# Patient Record
Sex: Female | Born: 1935
Health system: Southern US, Community
[De-identification: ages and names within clinical notes are randomized; demographics above are authoritative.]

## PROBLEM LIST (undated history)

## (undated) DIAGNOSIS — F32A Depression, unspecified: Secondary | ICD-10-CM

## (undated) DIAGNOSIS — I4892 Unspecified atrial flutter: Secondary | ICD-10-CM

## (undated) DIAGNOSIS — E079 Disorder of thyroid, unspecified: Secondary | ICD-10-CM

---

## 2001-11-15 ENCOUNTER — Other Ambulatory Visit: Admission: RE | Admit: 2001-11-15 | Discharge: 2001-11-15 | Payer: Self-pay | Admitting: Obstetrics and Gynecology

## 2002-12-26 ENCOUNTER — Encounter: Payer: Self-pay | Admitting: Family Medicine

## 2002-12-26 ENCOUNTER — Encounter: Admission: RE | Admit: 2002-12-26 | Discharge: 2002-12-26 | Payer: Self-pay | Admitting: Family Medicine

## 2004-07-01 ENCOUNTER — Ambulatory Visit (HOSPITAL_COMMUNITY): Admission: RE | Admit: 2004-07-01 | Discharge: 2004-07-01 | Payer: Self-pay | Admitting: Gastroenterology

## 2004-11-07 ENCOUNTER — Encounter: Admission: RE | Admit: 2004-11-07 | Discharge: 2004-11-07 | Payer: Self-pay | Admitting: Family Medicine

## 2004-12-24 ENCOUNTER — Encounter: Admission: RE | Admit: 2004-12-24 | Discharge: 2004-12-24 | Payer: Self-pay | Admitting: Family Medicine

## 2016-02-05 DIAGNOSIS — Z79899 Other long term (current) drug therapy: Secondary | ICD-10-CM | POA: Diagnosis not present

## 2016-02-05 DIAGNOSIS — F4320 Adjustment disorder, unspecified: Secondary | ICD-10-CM | POA: Diagnosis not present

## 2016-02-18 DIAGNOSIS — L570 Actinic keratosis: Secondary | ICD-10-CM | POA: Diagnosis not present

## 2016-02-18 DIAGNOSIS — L920 Granuloma annulare: Secondary | ICD-10-CM | POA: Diagnosis not present

## 2016-03-12 DIAGNOSIS — H524 Presbyopia: Secondary | ICD-10-CM | POA: Diagnosis not present

## 2016-03-17 DIAGNOSIS — L570 Actinic keratosis: Secondary | ICD-10-CM | POA: Diagnosis not present

## 2016-08-24 DIAGNOSIS — Z79899 Other long term (current) drug therapy: Secondary | ICD-10-CM | POA: Diagnosis not present

## 2016-08-24 DIAGNOSIS — M810 Age-related osteoporosis without current pathological fracture: Secondary | ICD-10-CM | POA: Diagnosis not present

## 2016-08-24 DIAGNOSIS — M150 Primary generalized (osteo)arthritis: Secondary | ICD-10-CM | POA: Diagnosis not present

## 2016-08-24 DIAGNOSIS — K589 Irritable bowel syndrome without diarrhea: Secondary | ICD-10-CM | POA: Diagnosis not present

## 2016-08-24 DIAGNOSIS — R636 Underweight: Secondary | ICD-10-CM | POA: Diagnosis not present

## 2016-08-24 DIAGNOSIS — F322 Major depressive disorder, single episode, severe without psychotic features: Secondary | ICD-10-CM | POA: Diagnosis not present

## 2016-08-24 DIAGNOSIS — F4320 Adjustment disorder, unspecified: Secondary | ICD-10-CM | POA: Diagnosis not present

## 2016-08-24 DIAGNOSIS — L858 Other specified epidermal thickening: Secondary | ICD-10-CM | POA: Diagnosis not present

## 2016-08-24 DIAGNOSIS — Z23 Encounter for immunization: Secondary | ICD-10-CM | POA: Diagnosis not present

## 2016-08-24 DIAGNOSIS — Z0000 Encounter for general adult medical examination without abnormal findings: Secondary | ICD-10-CM | POA: Diagnosis not present

## 2016-09-09 DIAGNOSIS — M810 Age-related osteoporosis without current pathological fracture: Secondary | ICD-10-CM | POA: Diagnosis not present

## 2016-09-15 DIAGNOSIS — C44719 Basal cell carcinoma of skin of left lower limb, including hip: Secondary | ICD-10-CM | POA: Diagnosis not present

## 2016-09-15 DIAGNOSIS — C44629 Squamous cell carcinoma of skin of left upper limb, including shoulder: Secondary | ICD-10-CM | POA: Diagnosis not present

## 2016-11-23 DIAGNOSIS — Z6822 Body mass index (BMI) 22.0-22.9, adult: Secondary | ICD-10-CM | POA: Diagnosis not present

## 2016-11-23 DIAGNOSIS — R636 Underweight: Secondary | ICD-10-CM | POA: Diagnosis not present

## 2016-11-23 DIAGNOSIS — F322 Major depressive disorder, single episode, severe without psychotic features: Secondary | ICD-10-CM | POA: Diagnosis not present

## 2016-11-26 DIAGNOSIS — C44719 Basal cell carcinoma of skin of left lower limb, including hip: Secondary | ICD-10-CM | POA: Diagnosis not present

## 2016-12-17 DIAGNOSIS — J069 Acute upper respiratory infection, unspecified: Secondary | ICD-10-CM | POA: Diagnosis not present

## 2017-01-18 DIAGNOSIS — H6123 Impacted cerumen, bilateral: Secondary | ICD-10-CM | POA: Diagnosis not present

## 2017-01-18 DIAGNOSIS — H6523 Chronic serous otitis media, bilateral: Secondary | ICD-10-CM | POA: Diagnosis not present

## 2017-02-17 DIAGNOSIS — L0390 Cellulitis, unspecified: Secondary | ICD-10-CM | POA: Diagnosis not present

## 2017-02-17 DIAGNOSIS — T798XXA Other early complications of trauma, initial encounter: Secondary | ICD-10-CM | POA: Diagnosis not present

## 2017-02-17 DIAGNOSIS — H906 Mixed conductive and sensorineural hearing loss, bilateral: Secondary | ICD-10-CM | POA: Diagnosis not present

## 2017-03-15 DIAGNOSIS — H524 Presbyopia: Secondary | ICD-10-CM | POA: Diagnosis not present

## 2017-04-12 DIAGNOSIS — L570 Actinic keratosis: Secondary | ICD-10-CM | POA: Diagnosis not present

## 2017-04-12 DIAGNOSIS — C44719 Basal cell carcinoma of skin of left lower limb, including hip: Secondary | ICD-10-CM | POA: Diagnosis not present

## 2017-06-29 DIAGNOSIS — C44719 Basal cell carcinoma of skin of left lower limb, including hip: Secondary | ICD-10-CM | POA: Diagnosis not present

## 2017-06-29 DIAGNOSIS — L570 Actinic keratosis: Secondary | ICD-10-CM | POA: Diagnosis not present

## 2017-08-31 DIAGNOSIS — M150 Primary generalized (osteo)arthritis: Secondary | ICD-10-CM | POA: Diagnosis not present

## 2017-08-31 DIAGNOSIS — M810 Age-related osteoporosis without current pathological fracture: Secondary | ICD-10-CM | POA: Diagnosis not present

## 2017-08-31 DIAGNOSIS — Z0000 Encounter for general adult medical examination without abnormal findings: Secondary | ICD-10-CM | POA: Diagnosis not present

## 2017-08-31 DIAGNOSIS — K589 Irritable bowel syndrome without diarrhea: Secondary | ICD-10-CM | POA: Diagnosis not present

## 2017-10-06 DIAGNOSIS — L570 Actinic keratosis: Secondary | ICD-10-CM | POA: Diagnosis not present

## 2017-10-06 DIAGNOSIS — Z85828 Personal history of other malignant neoplasm of skin: Secondary | ICD-10-CM | POA: Diagnosis not present

## 2017-10-06 DIAGNOSIS — L905 Scar conditions and fibrosis of skin: Secondary | ICD-10-CM | POA: Diagnosis not present

## 2018-01-04 DIAGNOSIS — L905 Scar conditions and fibrosis of skin: Secondary | ICD-10-CM | POA: Diagnosis not present

## 2018-01-04 DIAGNOSIS — L570 Actinic keratosis: Secondary | ICD-10-CM | POA: Diagnosis not present

## 2018-01-04 DIAGNOSIS — Z85828 Personal history of other malignant neoplasm of skin: Secondary | ICD-10-CM | POA: Diagnosis not present

## 2018-04-05 DIAGNOSIS — Z85828 Personal history of other malignant neoplasm of skin: Secondary | ICD-10-CM | POA: Diagnosis not present

## 2018-04-05 DIAGNOSIS — L905 Scar conditions and fibrosis of skin: Secondary | ICD-10-CM | POA: Diagnosis not present

## 2018-04-27 DIAGNOSIS — H5213 Myopia, bilateral: Secondary | ICD-10-CM | POA: Diagnosis not present

## 2018-04-27 DIAGNOSIS — H524 Presbyopia: Secondary | ICD-10-CM | POA: Diagnosis not present

## 2018-04-27 DIAGNOSIS — H52203 Unspecified astigmatism, bilateral: Secondary | ICD-10-CM | POA: Diagnosis not present

## 2018-04-27 DIAGNOSIS — Z961 Presence of intraocular lens: Secondary | ICD-10-CM | POA: Diagnosis not present

## 2018-09-09 DIAGNOSIS — Z01419 Encounter for gynecological examination (general) (routine) without abnormal findings: Secondary | ICD-10-CM | POA: Diagnosis not present

## 2018-09-09 DIAGNOSIS — N9089 Other specified noninflammatory disorders of vulva and perineum: Secondary | ICD-10-CM | POA: Diagnosis not present

## 2018-09-14 DIAGNOSIS — F322 Major depressive disorder, single episode, severe without psychotic features: Secondary | ICD-10-CM | POA: Diagnosis not present

## 2018-09-14 DIAGNOSIS — Z0000 Encounter for general adult medical examination without abnormal findings: Secondary | ICD-10-CM | POA: Diagnosis not present

## 2018-09-14 DIAGNOSIS — M810 Age-related osteoporosis without current pathological fracture: Secondary | ICD-10-CM | POA: Diagnosis not present

## 2018-09-14 DIAGNOSIS — K589 Irritable bowel syndrome without diarrhea: Secondary | ICD-10-CM | POA: Diagnosis not present

## 2018-09-23 DIAGNOSIS — Z8262 Family history of osteoporosis: Secondary | ICD-10-CM | POA: Diagnosis not present

## 2018-09-23 DIAGNOSIS — Z803 Family history of malignant neoplasm of breast: Secondary | ICD-10-CM | POA: Diagnosis not present

## 2018-09-23 DIAGNOSIS — Z1231 Encounter for screening mammogram for malignant neoplasm of breast: Secondary | ICD-10-CM | POA: Diagnosis not present

## 2018-09-23 DIAGNOSIS — M810 Age-related osteoporosis without current pathological fracture: Secondary | ICD-10-CM | POA: Diagnosis not present

## 2018-09-23 DIAGNOSIS — Z780 Asymptomatic menopausal state: Secondary | ICD-10-CM | POA: Diagnosis not present

## 2018-10-04 DIAGNOSIS — C44719 Basal cell carcinoma of skin of left lower limb, including hip: Secondary | ICD-10-CM | POA: Diagnosis not present

## 2018-10-04 DIAGNOSIS — Z85828 Personal history of other malignant neoplasm of skin: Secondary | ICD-10-CM | POA: Diagnosis not present

## 2018-10-04 DIAGNOSIS — L570 Actinic keratosis: Secondary | ICD-10-CM | POA: Diagnosis not present

## 2019-01-10 DIAGNOSIS — C44719 Basal cell carcinoma of skin of left lower limb, including hip: Secondary | ICD-10-CM | POA: Diagnosis not present

## 2019-05-02 DIAGNOSIS — H524 Presbyopia: Secondary | ICD-10-CM | POA: Diagnosis not present

## 2019-05-02 DIAGNOSIS — H5213 Myopia, bilateral: Secondary | ICD-10-CM | POA: Diagnosis not present

## 2019-05-02 DIAGNOSIS — H52203 Unspecified astigmatism, bilateral: Secondary | ICD-10-CM | POA: Diagnosis not present

## 2019-05-02 DIAGNOSIS — Z961 Presence of intraocular lens: Secondary | ICD-10-CM | POA: Diagnosis not present

## 2019-05-10 DIAGNOSIS — Z0120 Encounter for dental examination and cleaning without abnormal findings: Secondary | ICD-10-CM | POA: Diagnosis not present

## 2019-05-16 DIAGNOSIS — Z85828 Personal history of other malignant neoplasm of skin: Secondary | ICD-10-CM | POA: Diagnosis not present

## 2019-05-16 DIAGNOSIS — L920 Granuloma annulare: Secondary | ICD-10-CM | POA: Diagnosis not present

## 2019-05-16 DIAGNOSIS — D485 Neoplasm of uncertain behavior of skin: Secondary | ICD-10-CM | POA: Diagnosis not present

## 2019-07-19 DIAGNOSIS — R51 Headache: Secondary | ICD-10-CM | POA: Diagnosis not present

## 2019-07-19 DIAGNOSIS — R5383 Other fatigue: Secondary | ICD-10-CM | POA: Diagnosis not present

## 2019-07-19 DIAGNOSIS — Z7189 Other specified counseling: Secondary | ICD-10-CM | POA: Diagnosis not present

## 2019-08-05 DIAGNOSIS — Z23 Encounter for immunization: Secondary | ICD-10-CM | POA: Diagnosis not present

## 2019-10-03 DIAGNOSIS — Z79899 Other long term (current) drug therapy: Secondary | ICD-10-CM | POA: Diagnosis not present

## 2019-10-04 DIAGNOSIS — F322 Major depressive disorder, single episode, severe without psychotic features: Secondary | ICD-10-CM | POA: Diagnosis not present

## 2019-10-04 DIAGNOSIS — M810 Age-related osteoporosis without current pathological fracture: Secondary | ICD-10-CM | POA: Diagnosis not present

## 2019-10-04 DIAGNOSIS — Z0000 Encounter for general adult medical examination without abnormal findings: Secondary | ICD-10-CM | POA: Diagnosis not present

## 2019-10-04 DIAGNOSIS — K589 Irritable bowel syndrome without diarrhea: Secondary | ICD-10-CM | POA: Diagnosis not present

## 2019-11-13 DIAGNOSIS — L920 Granuloma annulare: Secondary | ICD-10-CM | POA: Diagnosis not present

## 2019-11-13 DIAGNOSIS — L570 Actinic keratosis: Secondary | ICD-10-CM | POA: Diagnosis not present

## 2019-11-13 DIAGNOSIS — Z85828 Personal history of other malignant neoplasm of skin: Secondary | ICD-10-CM | POA: Diagnosis not present

## 2019-11-13 DIAGNOSIS — L905 Scar conditions and fibrosis of skin: Secondary | ICD-10-CM | POA: Diagnosis not present

## 2019-11-14 DIAGNOSIS — Z0120 Encounter for dental examination and cleaning without abnormal findings: Secondary | ICD-10-CM | POA: Diagnosis not present

## 2020-05-07 DIAGNOSIS — H52203 Unspecified astigmatism, bilateral: Secondary | ICD-10-CM | POA: Diagnosis not present

## 2020-05-07 DIAGNOSIS — H524 Presbyopia: Secondary | ICD-10-CM | POA: Diagnosis not present

## 2020-05-07 DIAGNOSIS — Z961 Presence of intraocular lens: Secondary | ICD-10-CM | POA: Diagnosis not present

## 2020-05-08 DIAGNOSIS — F322 Major depressive disorder, single episode, severe without psychotic features: Secondary | ICD-10-CM | POA: Diagnosis not present

## 2020-05-08 DIAGNOSIS — L723 Sebaceous cyst: Secondary | ICD-10-CM | POA: Diagnosis not present

## 2020-05-08 DIAGNOSIS — R636 Underweight: Secondary | ICD-10-CM | POA: Diagnosis not present

## 2020-07-16 DIAGNOSIS — Z0120 Encounter for dental examination and cleaning without abnormal findings: Secondary | ICD-10-CM | POA: Diagnosis not present

## 2020-08-26 DIAGNOSIS — Z803 Family history of malignant neoplasm of breast: Secondary | ICD-10-CM | POA: Diagnosis not present

## 2020-08-26 DIAGNOSIS — Z1231 Encounter for screening mammogram for malignant neoplasm of breast: Secondary | ICD-10-CM | POA: Diagnosis not present

## 2020-09-11 DIAGNOSIS — R2689 Other abnormalities of gait and mobility: Secondary | ICD-10-CM | POA: Diagnosis not present

## 2020-09-11 DIAGNOSIS — Z23 Encounter for immunization: Secondary | ICD-10-CM | POA: Diagnosis not present

## 2020-09-17 DIAGNOSIS — M1990 Unspecified osteoarthritis, unspecified site: Secondary | ICD-10-CM | POA: Diagnosis not present

## 2020-09-17 DIAGNOSIS — F339 Major depressive disorder, recurrent, unspecified: Secondary | ICD-10-CM | POA: Diagnosis not present

## 2020-09-17 DIAGNOSIS — H9193 Unspecified hearing loss, bilateral: Secondary | ICD-10-CM | POA: Diagnosis not present

## 2020-09-17 DIAGNOSIS — Z9181 History of falling: Secondary | ICD-10-CM | POA: Diagnosis not present

## 2020-09-17 DIAGNOSIS — N393 Stress incontinence (female) (male): Secondary | ICD-10-CM | POA: Diagnosis not present

## 2020-09-17 DIAGNOSIS — R270 Ataxia, unspecified: Secondary | ICD-10-CM | POA: Diagnosis not present

## 2020-09-17 DIAGNOSIS — M810 Age-related osteoporosis without current pathological fracture: Secondary | ICD-10-CM | POA: Diagnosis not present

## 2020-09-17 DIAGNOSIS — K589 Irritable bowel syndrome without diarrhea: Secondary | ICD-10-CM | POA: Diagnosis not present

## 2020-09-23 DIAGNOSIS — C44719 Basal cell carcinoma of skin of left lower limb, including hip: Secondary | ICD-10-CM | POA: Diagnosis not present

## 2020-10-17 DIAGNOSIS — R270 Ataxia, unspecified: Secondary | ICD-10-CM | POA: Diagnosis not present

## 2020-10-17 DIAGNOSIS — K589 Irritable bowel syndrome without diarrhea: Secondary | ICD-10-CM | POA: Diagnosis not present

## 2020-10-17 DIAGNOSIS — M1990 Unspecified osteoarthritis, unspecified site: Secondary | ICD-10-CM | POA: Diagnosis not present

## 2020-10-17 DIAGNOSIS — F339 Major depressive disorder, recurrent, unspecified: Secondary | ICD-10-CM | POA: Diagnosis not present

## 2020-10-17 DIAGNOSIS — Z9181 History of falling: Secondary | ICD-10-CM | POA: Diagnosis not present

## 2020-10-17 DIAGNOSIS — M810 Age-related osteoporosis without current pathological fracture: Secondary | ICD-10-CM | POA: Diagnosis not present

## 2020-10-17 DIAGNOSIS — N393 Stress incontinence (female) (male): Secondary | ICD-10-CM | POA: Diagnosis not present

## 2020-10-17 DIAGNOSIS — H9193 Unspecified hearing loss, bilateral: Secondary | ICD-10-CM | POA: Diagnosis not present

## 2020-11-06 DIAGNOSIS — Z0000 Encounter for general adult medical examination without abnormal findings: Secondary | ICD-10-CM | POA: Diagnosis not present

## 2020-11-06 DIAGNOSIS — F322 Major depressive disorder, single episode, severe without psychotic features: Secondary | ICD-10-CM | POA: Diagnosis not present

## 2020-11-06 DIAGNOSIS — R5383 Other fatigue: Secondary | ICD-10-CM | POA: Diagnosis not present

## 2020-11-06 DIAGNOSIS — M150 Primary generalized (osteo)arthritis: Secondary | ICD-10-CM | POA: Diagnosis not present

## 2020-11-06 DIAGNOSIS — M810 Age-related osteoporosis without current pathological fracture: Secondary | ICD-10-CM | POA: Diagnosis not present

## 2020-11-16 DIAGNOSIS — H9193 Unspecified hearing loss, bilateral: Secondary | ICD-10-CM | POA: Diagnosis not present

## 2020-11-16 DIAGNOSIS — R270 Ataxia, unspecified: Secondary | ICD-10-CM | POA: Diagnosis not present

## 2020-11-16 DIAGNOSIS — K589 Irritable bowel syndrome without diarrhea: Secondary | ICD-10-CM | POA: Diagnosis not present

## 2020-11-16 DIAGNOSIS — N393 Stress incontinence (female) (male): Secondary | ICD-10-CM | POA: Diagnosis not present

## 2020-11-16 DIAGNOSIS — F339 Major depressive disorder, recurrent, unspecified: Secondary | ICD-10-CM | POA: Diagnosis not present

## 2020-11-16 DIAGNOSIS — M810 Age-related osteoporosis without current pathological fracture: Secondary | ICD-10-CM | POA: Diagnosis not present

## 2020-11-16 DIAGNOSIS — Z9181 History of falling: Secondary | ICD-10-CM | POA: Diagnosis not present

## 2020-11-16 DIAGNOSIS — M1990 Unspecified osteoarthritis, unspecified site: Secondary | ICD-10-CM | POA: Diagnosis not present

## 2020-12-20 ENCOUNTER — Emergency Department (HOSPITAL_COMMUNITY): Payer: Medicare Other

## 2020-12-20 ENCOUNTER — Encounter (HOSPITAL_COMMUNITY): Payer: Self-pay | Admitting: *Deleted

## 2020-12-20 ENCOUNTER — Other Ambulatory Visit: Payer: Self-pay

## 2020-12-20 ENCOUNTER — Inpatient Hospital Stay (HOSPITAL_COMMUNITY)
Admission: EM | Admit: 2020-12-20 | Discharge: 2020-12-24 | DRG: 310 | Disposition: A | Discharge status: Home / Self Care | Payer: Medicare Other | Attending: Internal Medicine | Admitting: Internal Medicine

## 2020-12-20 DIAGNOSIS — Z79890 Hormone replacement therapy (postmenopausal): Secondary | ICD-10-CM

## 2020-12-20 DIAGNOSIS — I9589 Other hypotension: Secondary | ICD-10-CM | POA: Diagnosis not present

## 2020-12-20 DIAGNOSIS — I483 Typical atrial flutter: Secondary | ICD-10-CM | POA: Diagnosis not present

## 2020-12-20 DIAGNOSIS — I4892 Unspecified atrial flutter: Secondary | ICD-10-CM | POA: Diagnosis not present

## 2020-12-20 DIAGNOSIS — I4891 Unspecified atrial fibrillation: Secondary | ICD-10-CM | POA: Diagnosis present

## 2020-12-20 DIAGNOSIS — Z20822 Contact with and (suspected) exposure to covid-19: Secondary | ICD-10-CM | POA: Diagnosis present

## 2020-12-20 DIAGNOSIS — R002 Palpitations: Secondary | ICD-10-CM | POA: Diagnosis not present

## 2020-12-20 DIAGNOSIS — R001 Bradycardia, unspecified: Secondary | ICD-10-CM | POA: Diagnosis present

## 2020-12-20 DIAGNOSIS — R079 Chest pain, unspecified: Secondary | ICD-10-CM | POA: Diagnosis not present

## 2020-12-20 DIAGNOSIS — E039 Hypothyroidism, unspecified: Secondary | ICD-10-CM | POA: Diagnosis present

## 2020-12-20 DIAGNOSIS — R55 Syncope and collapse: Secondary | ICD-10-CM

## 2020-12-20 DIAGNOSIS — T461X5A Adverse effect of calcium-channel blockers, initial encounter: Secondary | ICD-10-CM | POA: Diagnosis not present

## 2020-12-20 DIAGNOSIS — Z79899 Other long term (current) drug therapy: Secondary | ICD-10-CM

## 2020-12-20 DIAGNOSIS — R0789 Other chest pain: Secondary | ICD-10-CM | POA: Diagnosis not present

## 2020-12-20 DIAGNOSIS — I499 Cardiac arrhythmia, unspecified: Secondary | ICD-10-CM | POA: Diagnosis not present

## 2020-12-20 DIAGNOSIS — I471 Supraventricular tachycardia: Secondary | ICD-10-CM | POA: Diagnosis not present

## 2020-12-20 DIAGNOSIS — F32A Depression, unspecified: Secondary | ICD-10-CM | POA: Diagnosis present

## 2020-12-20 HISTORY — DX: Disorder of thyroid, unspecified: E07.9

## 2020-12-20 HISTORY — DX: Depression, unspecified: F32.A

## 2020-12-20 LAB — TSH: TSH: 10.459 u[IU]/mL — ABNORMAL HIGH (ref 0.350–4.500)

## 2020-12-20 LAB — COMPREHENSIVE METABOLIC PANEL
ALT: 25 U/L (ref 0–44)
AST: 39 U/L (ref 15–41)
Albumin: 4 g/dL (ref 3.5–5.0)
Alkaline Phosphatase: 63 U/L (ref 38–126)
Anion gap: 12 (ref 5–15)
BUN: 20 mg/dL (ref 8–23)
CO2: 24 mmol/L (ref 22–32)
Calcium: 10 mg/dL (ref 8.9–10.3)
Chloride: 103 mmol/L (ref 98–111)
Creatinine, Ser: 0.92 mg/dL (ref 0.44–1.00)
GFR, Estimated: 60 mL/min — ABNORMAL LOW (ref >60)
Glucose, Bld: 105 mg/dL — ABNORMAL HIGH (ref 70–99)
Potassium: 4.3 mmol/L (ref 3.5–5.1)
Sodium: 139 mmol/L (ref 135–145)
Total Bilirubin: 0.8 mg/dL (ref 0.3–1.2)
Total Protein: 6.9 g/dL (ref 6.5–8.1)

## 2020-12-20 LAB — CBC WITH DIFFERENTIAL/PLATELET
Abs Immature Granulocytes: 0.03 10*3/uL (ref 0.00–0.07)
Basophils Absolute: 0 10*3/uL (ref 0.0–0.1)
Basophils Relative: 0 %
Eosinophils Absolute: 0 10*3/uL (ref 0.0–0.5)
Eosinophils Relative: 0 %
HCT: 45.2 % (ref 36.0–46.0)
Hemoglobin: 14.2 g/dL (ref 12.0–15.0)
Immature Granulocytes: 0 %
Lymphocytes Relative: 14 %
Lymphs Abs: 1.3 10*3/uL (ref 0.7–4.0)
MCH: 32.5 pg (ref 26.0–34.0)
MCHC: 31.4 g/dL (ref 30.0–36.0)
MCV: 103.4 fL — ABNORMAL HIGH (ref 80.0–100.0)
Monocytes Absolute: 0.6 10*3/uL (ref 0.1–1.0)
Monocytes Relative: 6 %
Neutro Abs: 7.4 10*3/uL (ref 1.7–7.7)
Neutrophils Relative %: 80 %
Platelets: 199 10*3/uL (ref 150–400)
RBC: 4.37 MIL/uL (ref 3.87–5.11)
RDW: 12.2 % (ref 11.5–15.5)
WBC: 9.4 10*3/uL (ref 4.0–10.5)
nRBC: 0 % (ref 0.0–0.2)

## 2020-12-20 LAB — PROTIME-INR
INR: 1 (ref 0.8–1.2)
Prothrombin Time: 12.7 seconds (ref 11.4–15.2)

## 2020-12-20 LAB — RESP PANEL BY RT-PCR (FLU A&B, COVID) ARPGX2
Influenza A by PCR: NEGATIVE
Influenza B by PCR: NEGATIVE
SARS Coronavirus 2 by RT PCR: NEGATIVE

## 2020-12-20 LAB — BRAIN NATRIURETIC PEPTIDE: B Natriuretic Peptide: 238.1 pg/mL — ABNORMAL HIGH (ref 0.0–100.0)

## 2020-12-20 LAB — TROPONIN I (HIGH SENSITIVITY)
Troponin I (High Sensitivity): 13 ng/L (ref <18)
Troponin I (High Sensitivity): 16 ng/L (ref <18)

## 2020-12-20 LAB — HEPARIN LEVEL (UNFRACTIONATED): Heparin Unfractionated: 0.89 IU/mL — ABNORMAL HIGH (ref 0.30–0.70)

## 2020-12-20 LAB — MAGNESIUM: Magnesium: 2.2 mg/dL (ref 1.7–2.4)

## 2020-12-20 MED ORDER — ACETAMINOPHEN 325 MG PO TABS: 650.0000 mg | ORAL_TABLET | ORAL | Status: DC | PRN

## 2020-12-20 MED ORDER — METOPROLOL TARTRATE 12.5 MG HALF TABLET
12.5000 mg | ORAL_TABLET | Freq: Two times a day (BID) | ORAL | Status: DC
  Administered 2020-12-20 – 2020-12-21 (×3): 12.5 mg via ORAL
  Filled 2020-12-20 (×3): qty 1

## 2020-12-20 MED ORDER — ONDANSETRON HCL 4 MG/2ML IJ SOLN: 4.0000 mg | Freq: Four times a day (QID) | INTRAMUSCULAR | Status: DC | PRN

## 2020-12-20 MED ORDER — HEPARIN (PORCINE) 25000 UT/250ML-% IV SOLN
700.0000 [IU]/h | INTRAVENOUS | Status: DC
  Administered 2020-12-20: 900 [IU]/h via INTRAVENOUS
  Filled 2020-12-20 (×2): qty 250

## 2020-12-20 MED ORDER — METOPROLOL TARTRATE 5 MG/5ML IV SOLN
5.0000 mg | Freq: Once | INTRAVENOUS | Status: AC
  Administered 2020-12-20: 5 mg via INTRAVENOUS
  Filled 2020-12-20: qty 5

## 2020-12-20 MED ORDER — HEPARIN BOLUS VIA INFUSION
3500.0000 [IU] | Freq: Once | INTRAVENOUS | Status: AC
  Administered 2020-12-20: 3500 [IU] via INTRAVENOUS
  Filled 2020-12-20: qty 3500

## 2020-12-20 NOTE — ED Triage Notes (Addendum)
Pt here via [facility] for 2 "spells" the last few days where her heart starts racing and coming out of her chest and she feels "really woozy like".  No falls or loc.  EMS stated initial HR was afib that changed to a flutter after 2 minutes.  Initial O2 as 88% and increased to 100% on 3L.  Sats presently 100% on RA. Pt states she was supposed to go the the Dr's office today to have her thyroid checked.  States she has been on thyroid meds x 1 month.

## 2020-12-20 NOTE — Consult Note (Signed)
Cardiology Consultation:   Patient ID: Diana Lee MRN: [id number]; DOB: 31-May-1929  Admit date: 12/20/2020 Date of Consult: 12/20/2020  PCP:  [name], MD   [facility]  Cardiologist:  No primary care provider on file. - New Dr. Jacques Navy Advanced Practice Provider:  No care team member to display Electrophysiologist:  None       Patient Profile:   Diana Lee is a 87 y.o. female with a hx of hypothyroidism and depression who is being seen today for the evaluation of atrial flutter at the request of Dr. Katrinka Blazing.  History of Present Illness:   Diana Lee is a remarkably healthy 87 year old female with a past medical history only significant for hypothyroidism and depression.  She has no past cardiovascular history.  She has tried to live a healthy life with activity and good diet.  She has recently been working with physical therapy to build her strength and felt strong enough on Wednesday evening to go outside and pick up sticks.  The weather was good and she thought this would be good for her.  As she was working she began to feel unwell and recalls speaking to her son who is present in the room today for a while while she took some rest, but shortly afterward felt unwell and almost as if she were crawling back into the house.  She notes a sense of racing heart and as if her heart was going to beat out of her chest.  Denies chest pain or associated shortness of breath denies significant lightheadedness and had no syncope.  She had a good day yesterday and does not recall palpitations, chest pain, or shortness of breath.  Today she had a recurrence of rapid heartbeats and palpitations and presented to her doctor for thyroid laboratory studies.  TSH was found to be around 10, and due to palpitations she presented to the ER.  EKG: Atrial flutter with 2-1 AV block and a heart rate of around 129 bpm.  Poor R wave progression with anterior infarct pattern.  Patient was  given 5 mg of IV metoprolol in the ED which has brought her heart rate down into the 90s to 110 bpm range on telemetry now.  She is comfortably lying in bed without active palpitations.  She has been started on metoprolol tartrate 12.5 mg twice daily.  The patient denies chest pain, chest pressure, dyspnea at rest or with exertion, PND, orthopnea, or leg swelling. Denies cough, fever, chills. Denies nausea, vomiting. Denies syncope or presyncope. Denies dizziness or lightheadedness.  Record suggests she may have had a fever, but I have confirmed with the patient that she has had no urinary symptoms, no fever and no symptoms of pneumonia.    Past Medical History:  Diagnosis Date  . Depression   . Thyroid disease     History reviewed. No pertinent surgical history.   Home Medications:  Prior to Admission medications   Medication Sig Start Date End Date Taking? Authorizing Provider  acetaminophen (TYLENOL) 500 MG tablet Take 500 mg by mouth every 6 (six) hours as needed for mild pain.   Yes [provider]  Calcium Carbonate (CALTRATE 600 PO) Take 1 tablet by mouth daily.   Yes [provider]  cholecalciferol (VITAMIN D3) 25 MCG (1000 UNIT) tablet Take 1,000 Units by mouth daily.   Yes [provider]  levothyroxine (SYNTHROID) 25 MCG tablet Take 25 mcg by mouth daily. 11/11/20  Yes [provider]  sertraline (  ZOLOFT) 50 MG tablet Take 50 mg by mouth daily. 12/13/20  Yes [provider]    Inpatient Medications: Scheduled Meds: . metoprolol tartrate  12.5 mg Oral BID   Continuous Infusions: . heparin 900 Units/hr (12/20/20 1622)   PRN Meds: acetaminophen, ondansetron (ZOFRAN) IV  Allergies:   No Known Allergies  Social History:   Social History   Socioeconomic History  . Marital status: Married    Spouse name: Not on file  . Number of children: Not on file  . Years of education: Not on file  . Highest education level: Not on file   Occupational History  . Not on file  Tobacco Use  . Smoking status: Never Smoker  . Smokeless tobacco: Not on file  Vaping Use  . Vaping Use: Never used  Substance and Sexual Activity  . Alcohol use: Never  . Drug use: Never  . Sexual activity: Not Currently  Other Topics Concern  . Not on file  Social History Narrative  . Not on file   Social Determinants of Health   Financial Resource Strain: Not on file  Food Insecurity: Not on file  Transportation Needs: Not on file  Physical Activity: Not on file  Stress: Not on file  Social Connections: Not on file  Intimate Partner Violence: Not on file    Family History:   History reviewed. No pertinent family history.   ROS:  Please see the history of present illness.   All other ROS reviewed and negative.     Physical Exam/Data:   Vitals:   12/20/20 1718 12/20/20 1720 12/20/20 1914 12/20/20 2100  BP: (!) 147/100 (!) 143/106 96/62 138/67  Pulse: (!) 109  92 80  Resp:   16 18  Temp: 98.9 F (37.2 C)  98.2 F (36.8 C) 98.2 F (36.8 C)  TempSrc: Oral  Oral Oral  SpO2: 97% 98% 96% 96%  Weight:  60.9 kg    Height:  5\' 4"  (1.626 m)     No intake or output data in the 24 hours ending 12/20/20 2242 Last 3 Weights 12/20/2020 12/20/2020  Weight (lbs) 134 lb 4.8 oz 133 lb  Weight (kg) 60.918 kg 60.328 kg     Body mass index is 23.05 kg/m.  Constitutional: No acute distress Eyes: sclera non-icteric, normal conjunctiva and lids ENMT: normal dentition, moist mucous membranes Cardiovascular: regular rhythm, normal rate, no murmurs. S1 and S2 normal. Radial pulses normal bilaterally. No jugular venous distention.  Respiratory: clear to auscultation bilaterally GI : normal bowel sounds, soft and nontender. No distention.   MSK: extremities warm, well perfused. No edema.  NEURO: grossly nonfocal exam, moves all extremities. PSYCH: alert and oriented x 3, normal mood and affect.   EKG:  The EKG was personally reviewed and  demonstrates: Atrial flutter with 2-1 AV conduction with a heart rate of 130 bpm Telemetry:  Telemetry was personally reviewed and demonstrates: Atrial flutter variable conduction rate around 100  Relevant CV Studies: Echocardiogram ordered and pending  Laboratory Data:  High Sensitivity Troponin:   Recent Labs  Lab 12/20/20 1214 12/20/20 1616  TROPONINIHS 13 16     Chemistry Recent Labs  Lab 12/20/20 1214  NA 139  K 4.3  CL 103  CO2 24  GLUCOSE 105*  BUN 20  CREATININE 0.92  CALCIUM 10.0  GFRNONAA 60*  ANIONGAP 12    Recent Labs  Lab 12/20/20 1214  PROT 6.9  ALBUMIN 4.0  AST 39  ALT 25  ALKPHOS  63  BILITOT 0.8   Hematology Recent Labs  Lab 12/20/20 1214  WBC 9.4  RBC 4.37  HGB 14.2  HCT 45.2  MCV 103.4*  MCH 32.5  MCHC 31.4  RDW 12.2  PLT 199   BNP Recent Labs  Lab 12/20/20 1214  BNP 238.1*    DDimer No results for input(s): DDIMER in the last 168 hours.   Radiology/Studies:  DG Chest Portable 1 View  Result Date: 12/20/2020 CLINICAL DATA:  Palpitations. EXAM: PORTABLE CHEST 1 VIEW COMPARISON:  12/24/2004 FINDINGS: 1228 hours. Lungs are hyperexpanded. Interstitial markings are diffusely coarsened with chronic features. The cardiopericardial silhouette is within normal limits for size. Telemetry leads overlie the chest. IMPRESSION: Hyperexpansion with chronic interstitial coarsening. No acute cardiopulmonary findings. Electronically Signed   By: Kennith Center M.D.   On: 12/20/2020 12:40     Assessment and Plan:   Principal Problem:   Atrial flutter (HCC) Active Problems:   Hypothyroidism  She has a new diagnosis of atrial flutter.  Her CHA2DS2-VASc score is approximately 3 for age and sex.  She is currently on IV heparin, would consider transitioning this to [facility] tomorrow for continued anticoagulation.  We discussed stroke risk reduction in atrial arrhythmia and she is in agreement with starting anticoagulation.  This will also facilitate  possible cardioversion if needed in 3 to 4 weeks. Consider starting eliquis 5 mg BID tomorrow am (age >80, weight >60 kg, GFR 60, Cr 0.92).  Rate controlling medication seem to be helping at this time, would continue metoprolol tartrate 12.5 mg twice daily and will uptitrate as needed.  Would agree with getting an echocardiogram to evaluate for structural heart disease as a contributor to development of atrial flutter.  I suspect her atrial flutter is likely driven by hypothyroidism and correction of this will be important.  Her levothyroxine will need to be ordered as it has not been added back to her medication regimen yet, would recommend this to be coordinated by internal medicine.  CHA2DS2-VASc Score = 3 [CHF History: No, HTN History: No, Diabetes History: No, Stroke History: No, Vascular Disease History: No, Age Score: 2, Gender Score: 1].  Therefore, the patient's annual risk of stroke is 3.2 %.      Risk Assessment/Risk Scores:        CHA2DS2-VASc Score = 3  This indicates a 3.2% annual risk of stroke. The patient's score is based upon: CHF History: No HTN History: No Diabetes History: No Stroke History: No Vascular Disease History: No Age Score: 2 Gender Score: 1      For questions or updates, please contact [facility] Please consult [email or web address] for contact info under    Signed, Parke Poisson, MD  12/20/2020 10:42 PM

## 2020-12-20 NOTE — H&P (Addendum)
History and Physical    Diana Lee MWN:[id number] DOB: 12/25/1928 DOA: 12/20/2020  Referring MD/NP/PA:Brian Long, MD PCP: [name], MD  Patient coming from: Home via EMS  Chief Complaint: Spells where it feels like I could pass out  I have personally briefly reviewed patient's old medical records in [facility]   HPI: Diana Lee is a 87 y.o. female with medical history significant of hypothyroidism who presented with complaints of near syncope.  Patient reports having 2 spells over the last 2 days.  With these spells she feels like her heart is racing and the room is getting dark as if she could pass out.  She has not lost consciousness or fallen.  After being able to sit down she reported feeling better.  She had just recently been diagnosed with hypothyroidism last and started on levothyroxine 25 mcg daily by her PCP.  Patient has been taking the medication as prescribed, and was supposed to follow-up with her primary this morning.  However, she had another episode.  She felt as though her heart was coming out of her chest and it made her very anxious.  Denies any chest pain, shortness of breath, headache, focal weakness, nausea, vomiting, abdominal pain, or diarrhea.  Denies any significant heart history previously in the past.  At baseline patient is able to complete all of her ADLs and is able to ambulate without need of assistance.  In route with EMS patient was noted to be in atrial fibrillation and change into atrial flutter after 2 minutes. Initial O2 saturations 88% on room air with increased 100% on 3 L of nasal cannula oxygen.  ED Course: Upon admission into the emergency department patient was noted febrile with heart rates 127-130s, respirations 12-21, blood pressures 130/88-150/88, and O2 saturations currently maintained on room air.  Labs significant for high-sensitivity troponin 13, BNP 238.1, and TSH 10.459.  Chest x-ray showed hyperinflation with chronic interstitial  coarsening.  Patient has been started on a heparin drip and given metoprolol 5 mg IV.  Review of Systems  Constitutional: Negative for diaphoresis and fever.  HENT: Negative for nosebleeds and sinus pain.   Eyes: Negative for photophobia and pain.  Respiratory: Negative for cough, shortness of breath and wheezing.   Cardiovascular: Positive for palpitations. Negative for chest pain and leg swelling.  Gastrointestinal: Negative for abdominal pain, nausea and vomiting.  Genitourinary: Negative for dysuria and hematuria.  Musculoskeletal: Negative for joint pain and myalgias.  Skin: Negative for rash.  Neurological: Negative for focal weakness and loss of consciousness.  Psychiatric/Behavioral: The patient is nervous/anxious.     Past Medical History:  Diagnosis Date  . Depression   . Thyroid disease     History reviewed. No pertinent surgical history.   has no history on file for tobacco use, alcohol use, and drug use.  No Known Allergies  No significant family history noted  Prior to Admission medications   Not on File    Physical Exam:  Constitutional: Elderly female currently in no acute distress Vitals:   12/20/20 1245 12/20/20 1300 12/20/20 1315 12/20/20 1330  BP: (!) 133/99 129/84 (!) 129/96 130/88  Pulse:      Resp: (!) 21 14 15 13   Temp:      TempSrc:      SpO2:      Weight:      Height:       Eyes: PERRL, lids and conjunctivae normal ENMT: Mucous membranes are moist. Posterior pharynx clear of any  exudate or lesions.   Neck: normal, supple, no masses, no thyromegaly Respiratory: clear to auscultation bilaterally, no wheezing, no crackles. Normal respiratory effort. No accessory muscle use.  Cardiovascular: Tachycardic and irregularly, no murmurs / rubs / gallops. No extremity edema. 2+ pedal pulses. No carotid bruits.  Abdomen: no tenderness, no masses palpated. No hepatosplenomegaly. Bowel sounds positive.  Musculoskeletal: no clubbing / cyanosis. No  joint deformity upper and lower extremities. Good ROM, no contractures. Normal muscle tone.  Skin: no rashes, lesions, ulcers. No induration Neurologic: CN 2-12 grossly intact. Sensation intact, DTR normal. Strength 5/5 in all 4.  Psychiatric: Normal judgment and insight. Alert and oriented x 3. Normal mood.     Labs on Admission: I have personally reviewed following labs and imaging studies  CBC: Recent Labs  Lab 12/20/20 1214  WBC 9.4  NEUTROABS 7.4  HGB 14.2  HCT 45.2  MCV 103.4*  PLT 199   Basic Metabolic Panel: Recent Labs  Lab 12/20/20 1214  NA 139  K 4.3  CL 103  CO2 24  GLUCOSE 105*  BUN 20  CREATININE 0.92  CALCIUM 10.0   GFR: Estimated Creatinine Clearance: 35.8 mL/min (by C-G formula based on SCr of 0.92 mg/dL). Liver Function Tests: Recent Labs  Lab 12/20/20 1214  AST 39  ALT 25  ALKPHOS 63  BILITOT 0.8  PROT 6.9  ALBUMIN 4.0   No results for input(s): LIPASE, AMYLASE in the last 168 hours. No results for input(s): AMMONIA in the last 168 hours. Coagulation Profile: Recent Labs  Lab 12/20/20 1214  INR 1.0   Cardiac Enzymes: No results for input(s): CKTOTAL, CKMB, CKMBINDEX, TROPONINI in the last 168 hours. BNP (last 3 results) No results for input(s): PROBNP in the last 8760 hours. HbA1C: No results for input(s): HGBA1C in the last 72 hours. CBG: No results for input(s): GLUCAP in the last 168 hours. Lipid Profile: No results for input(s): CHOL, HDL, LDLCALC, TRIG, CHOLHDL, LDLDIRECT in the last 72 hours. Thyroid Function Tests: Recent Labs    12/20/20 1214  TSH 10.459*   Anemia Panel: No results for input(s): VITAMINB12, FOLATE, FERRITIN, TIBC, IRON, RETICCTPCT in the last 72 hours. Urine analysis: No results found for: COLORURINE, APPEARANCEUR, LABSPEC, PHURINE, GLUCOSEU, HGBUR, BILIRUBINUR, KETONESUR, PROTEINUR, UROBILINOGEN, NITRITE, LEUKOCYTESUR Sepsis Labs: Recent Results (from the past 240 hour(s))  Resp Panel by RT-PCR  (Flu A&B, Covid) Nasopharyngeal Swab     Status: None   Collection Time: 12/20/20 12:14 PM   Specimen: Nasopharyngeal Swab; Nasopharyngeal(NP) swabs in vial transport medium  Result Value Ref Range Status   SARS Coronavirus 2 by RT PCR NEGATIVE NEGATIVE Final    Comment: (NOTE) SARS-CoV-2 target nucleic acids are NOT DETECTED.  The SARS-CoV-2 RNA is generally detectable in upper respiratory specimens during the acute phase of infection. The lowest concentration of SARS-CoV-2 viral copies this assay can detect is 138 copies/mL. A negative result does not preclude SARS-Cov-2 infection and should not be used as the sole basis for treatment or other patient management decisions. A negative result may occur with  improper specimen collection/handling, submission of specimen other than nasopharyngeal swab, presence of viral mutation(s) within the areas targeted by this assay, and inadequate number of viral copies(<138 copies/mL). A negative result must be combined with clinical observations, patient history, and epidemiological information. The expected result is Negative.  Fact Sheet for Patients:  BloggerCourse.com  Fact Sheet for Healthcare Providers:  SeriousBroker.it  This test is no t yet approved or cleared by the  [place] [employer] and  has been authorized for detection and/or diagnosis of SARS-CoV-2 by FDA under an TEFL teacher (EUA). This EUA will remain  in effect (meaning this test can be used) for the duration of the COVID-19 declaration under Section 564(b)(1) of the Act, 21 U.S.C.section 360bbb-3(b)(1), unless the authorization is terminated  or revoked sooner.       Influenza A by PCR NEGATIVE NEGATIVE Final   Influenza B by PCR NEGATIVE NEGATIVE Final    Comment: (NOTE) The Xpert Xpress SARS-CoV-2/FLU/RSV plus assay is intended as an aid in the diagnosis of influenza from Nasopharyngeal swab specimens  and should not be used as a sole basis for treatment. Nasal washings and aspirates are unacceptable for Xpert Xpress SARS-CoV-2/FLU/RSV testing.  Fact Sheet for Patients: BloggerCourse.com  Fact Sheet for Healthcare Providers: SeriousBroker.it  This test is not yet approved or cleared by the [place] FDA and has been authorized for detection and/or diagnosis of SARS-CoV-2 by FDA under an Emergency Use Authorization (EUA). This EUA will remain in effect (meaning this test can be used) for the duration of the COVID-19 declaration under Section 564(b)(1) of the Act, 21 U.S.C. section 360bbb-3(b)(1), unless the authorization is terminated or revoked.  Performed at [facility], [address] [place], [place] [address]      Radiological Exams on Admission: DG Chest Portable 1 View  Result Date: 12/20/2020 CLINICAL DATA:  Palpitations. EXAM: PORTABLE CHEST 1 VIEW COMPARISON:  12/24/2004 FINDINGS: 1228 hours. Lungs are hyperexpanded. Interstitial markings are diffusely coarsened with chronic features. The cardiopericardial silhouette is within normal limits for size. Telemetry leads overlie the chest. IMPRESSION: Hyperexpansion with chronic interstitial coarsening. No acute cardiopulmonary findings. Electronically Signed   By: Kennith Center M.D.   On: 12/20/2020 12:40    EKG: Independently reviewed.  Atrial flutter with 2:1 block at 129 beats per  Assessment/Plan Atrial fibrillation/atrial flutter: Acute.  Patient present with complaints of palpitations and near syncope.  Initially reported to be in atrial fibrillation and then went into atrial flutter with EMS.  Chest x-ray showed heart size to be relatively within normal limits.  CHA2DS2-VASc score = 3(age and sex).  She had been ordered metoprolol 5 mg IV and started on heparin drip. -Admit to a progressive bed -Atrial fibrillation order set utilized -Continue heparin per  pharmacy -Goal potassium 4 and magnesium 2 -Check echocardiogram -Start metoprolol 12.5 mg twice daily -Cardiology consulted, will follow-up for further recommendation  Hypothyroidism: On admission TSH noted to be 10.459.  Her PCP started her on levothyroxine 25 mcg daily last month. -Follow-up free T4 and T3. -Would increase levothyroxine to 50 mcg daily    DVT prophylaxis: Heparin per pharmacy Code Status: Full Family Communication: Patient's son updated at bedside Disposition Plan: Likely discharge home in a.m. Consults called: Cardiology Admission status: Observation  Clydie Braun MD [facility]   If 7PM-7AM, please contact night-coverage   12/20/2020, 2:09 PM

## 2020-12-20 NOTE — ED Provider Notes (Signed)
Emergency Department Provider Note   I have reviewed the triage vital signs and the nursing notes.   HISTORY  Chief Complaint Palpitations and Near Syncope   HPI Diana Lee is a 87 y.o. female with relatively little known PMH other than recently being started on Synthroid by her PCP presents to the emergency department for evaluation of intermittent heart palpitations with lightheadedness and near syncope symptoms.  Patient has had at least 2 spells in the past several days which are similar although after resting her symptoms resolved.  This morning, she had return of heart palpitations which felt like "it was going to just come right out of my chest."  She is not experiencing active chest pain or palpitations currently.  She states that now "I feel fine."  No known history of A. fib/flutter.  She is not on blood thinners.  EMS arrived on scene and by report found her to be in A. fib with RVR but then reported a change to more of a flutter pattern.  Tracing was not obtained at that time for review here in the ED. Patient initially started on 3 L nasal cannula with reported RA sat in the 80s but that has since been turned off. No radiation of symptoms or modifying factors. No clear provoking factors.   Past Medical History:  Diagnosis Date  . Depression   . Thyroid disease     Patient Active Problem List   Diagnosis Date Noted  . Atrial flutter (HCC) 12/20/2020  . Hypothyroidism 12/20/2020   Allergies Patient has no known allergies.  History reviewed. No pertinent family history.  Social History Social History   Tobacco Use  . Smoking status: Never Smoker  Vaping Use  . Vaping Use: Never used  Substance Use Topics  . Alcohol use: Never  . Drug use: Never    Review of Systems  Constitutional: No fever/chills Eyes: No visual changes. ENT: No sore throat. Cardiovascular: Denies chest pain. Positive heart palpitations and near syncope.  Respiratory: Denies shortness  of breath. Gastrointestinal: No abdominal pain.  No nausea, no vomiting.  No diarrhea.  No constipation. Genitourinary: Negative for dysuria. Musculoskeletal: Negative for back pain. Skin: Negative for rash. Neurological: Negative for headaches, focal weakness or numbness.  10-point ROS otherwise negative.  ____________________________________________   PHYSICAL EXAM:  VITAL SIGNS: Vitals:   12/20/20 2327 12/21/20 0447  BP: 126/81 129/65  Pulse: 84 70  Resp: 18 15  Temp: 98 F (36.7 C) 97.9 F (36.6 C)  SpO2: 95% 97%    Constitutional: Alert and oriented. Well appearing and in no acute distress. Eyes: Conjunctivae are normal.  Head: Atraumatic. Nose: No congestion/rhinnorhea. Mouth/Throat: Mucous membranes are moist.   Neck: No stridor.   Cardiovascular: Tachycardia. Good peripheral circulation. Grossly normal heart sounds.   Respiratory: Normal respiratory effort.  No retractions. Lungs CTAB. Gastrointestinal: Soft and nontender. No distention.  Musculoskeletal: No lower extremity tenderness nor edema. No gross deformities of extremities. Neurologic:  Normal speech and language.  Skin:  Skin is warm, dry and intact. No rash noted.   ____________________________________________   LABS (all labs ordered are listed, but only abnormal results are displayed)  Labs Reviewed  COMPREHENSIVE METABOLIC PANEL - Abnormal; Notable for the following components:      Result Value   Glucose, Bld 105 (*)    GFR, Estimated 60 (*)    All other components within normal limits  BRAIN NATRIURETIC PEPTIDE - Abnormal; Notable for the following components:   B  Natriuretic Peptide 238.1 (*)    All other components within normal limits  CBC WITH DIFFERENTIAL/PLATELET - Abnormal; Notable for the following components:   MCV 103.4 (*)    All other components within normal limits  TSH - Abnormal; Notable for the following components:   TSH 10.459 (*)    All other components within normal  limits  HEPARIN LEVEL (UNFRACTIONATED) - Abnormal; Notable for the following components:   Heparin Unfractionated 0.89 (*)    All other components within normal limits  RESP PANEL BY RT-PCR (FLU A&B, COVID) ARPGX2  PROTIME-INR  T4  T3, FREE  MAGNESIUM  CBC  BASIC METABOLIC PANEL  HEPARIN LEVEL (UNFRACTIONATED)  TROPONIN I (HIGH SENSITIVITY)  TROPONIN I (HIGH SENSITIVITY)   ____________________________________________  EKG   EKG Interpretation  Date/Time:  Friday December 20 2020 11:55:22 EST Ventricular Rate:  129 PR Interval:    QRS Duration: 72 QT Interval:  241 QTC Calculation: 353 R Axis:   63 Text Interpretation: Atrial flutter with 2 to 1 block Probable anterior infarct, age indeterminate Abnormal T, consider ischemia, inferior leads Lateral leads are also involved No old tracing for comparison Confirmed by Alona Bene [id number]) on 12/20/2020 11:57:56 AM       ____________________________________________  RADIOLOGY  DG Chest Portable 1 View  Result Date: 12/20/2020 CLINICAL DATA:  Palpitations. EXAM: PORTABLE CHEST 1 VIEW COMPARISON:  12/24/2004 FINDINGS: 1228 hours. Lungs are hyperexpanded. Interstitial markings are diffusely coarsened with chronic features. The cardiopericardial silhouette is within normal limits for size. Telemetry leads overlie the chest. IMPRESSION: Hyperexpansion with chronic interstitial coarsening. No acute cardiopulmonary findings. Electronically Signed   By: Kennith Center M.D.   On: 12/20/2020 12:40    ____________________________________________   PROCEDURES  Procedure(s) performed:   .Critical Care Performed by: Maia Plan, MD Authorized by: Maia Plan, MD   Critical care provider statement:    Critical care time (minutes):  35   Critical care time was exclusive of:  Separately billable procedures and treating other patients and teaching time   Critical care was necessary to treat or prevent imminent or life-threatening  deterioration of the following conditions:  Circulatory failure   Critical care was time spent personally by me on the following activities:  Discussions with consultants, evaluation of patient's response to treatment, examination of patient, ordering and performing treatments and interventions, ordering and review of laboratory studies, ordering and review of radiographic studies, pulse oximetry, re-evaluation of patient's condition, obtaining history from patient or surrogate, review of old charts, blood draw for specimens and development of treatment plan with patient or surrogate   I assumed direction of critical care for this patient from another provider in my specialty: no       ____________________________________________   INITIAL IMPRESSION / ASSESSMENT AND PLAN / ED COURSE  Pertinent labs & imaging results that were available during my care of the patient were reviewed by me and considered in my medical decision making (see chart for details).   Patient presents to the emergency department with acute onset heart palpitations which have since resolved.  Patient remains in a tachycardic rhythm which is regular and seems most consistent with atrial flutter.  By report EMS describes some more irregular tachycardia which they thought to be A. fib but then reverted to flutter prior to obtaining an EKG. patient's blood pressure is elevated here.  Tachycardia related to thyroid dysfunction is on the differential and so we will start with metoprolol.  I do not see  a clear contraindication to anticoagulation.  Will opt for rate control as the patient is currently asymptomatic but does have flutter and I am unsure of an exact start time to best risk stratify for cardioversion. Patient is NOT hemodynamically unstable.   Patient's lab work resulting showing elevated TSH which does not correlate with the possibly hyperthyroid state.  She is responding to metoprolol pushes and not requiring infusion at  this time.  May ultimately require diltiazem infusion.  Have started heparin with no absolute contraindications.  Covid negative.  BNP mildly elevated. Plan for [facility] admit for rate control.   Discussed patient's case with [facility] to request admission. Patient and family (if present) updated with plan. Care transferred to [facility] service.  I reviewed all nursing notes, vitals, pertinent old records, EKGs, labs, imaging (as available).  ____________________________________________  FINAL CLINICAL IMPRESSION(S) / ED DIAGNOSES  Final diagnoses:  Atrial flutter, unspecified type (HCC)  Near syncope     MEDICATIONS GIVEN DURING THIS VISIT:  Medications  heparin ADULT infusion 100 units/mL (25000 units/253mL) (0 Units/hr Intravenous Duplicate 12/20/20 2358)  acetaminophen (TYLENOL) tablet 650 mg (has no administration in time range)  ondansetron (ZOFRAN) injection 4 mg (has no administration in time range)  metoprolol tartrate (LOPRESSOR) tablet 12.5 mg (12.5 mg Oral Given 12/20/20 2200)  metoprolol tartrate (LOPRESSOR) injection 5 mg (5 mg Intravenous Given 12/20/20 1223)  metoprolol tartrate (LOPRESSOR) injection 5 mg (5 mg Intravenous Given 12/20/20 1621)  heparin bolus via infusion 3,500 Units (3,500 Units Intravenous Bolus from Bag 12/20/20 1624)     Note:  This document was prepared using Dragon voice recognition software and may include unintentional dictation errors.  Alona Bene, MD, [facility] - Inpatient Emergency Medicine    Michaelanthony Kempton, Arlyss Repress, MD 12/21/20 [phone]

## 2020-12-20 NOTE — Progress Notes (Signed)
ANTICOAGULATION CONSULT NOTE - Initial Consult  Pharmacy Consult for heparin Indication: atrial fibrillation  No Known Allergies  Patient Measurements: Height: 5\' 4"  (162.6 cm) Weight: 60.3 kg (133 lb) IBW/kg (Calculated) : 54.7 Heparin Dosing Weight: 60.3 kg   Vital Signs: Temp: 98.5 F (36.9 C) (02/18 1157) Temp Source: Oral (02/18 1157) BP: 130/88 (02/18 1330) Pulse Rate: 127 (02/18 1230)  Labs: Recent Labs    12/20/20 1214  HGB 14.2  HCT 45.2  PLT 199  LABPROT 12.7  INR 1.0  CREATININE 0.92  TROPONINIHS 13    Estimated Creatinine Clearance: 35.8 mL/min (by C-G formula based on SCr of 0.92 mg/dL).   Medical History: Past Medical History:  Diagnosis Date  . Depression   . Thyroid disease     Medications:  (Not in a hospital admission)   Assessment: 49 YOF who presents with lightheadedness and near syncope found to have acute onset Afib with RVR. Pharmacy consulted to start IV heparin.   H/H and Plt wnl. SCr wnl.   Goal of Therapy:  Heparin level 0.3-0.7 units/ml Monitor platelets by anticoagulation protocol: Yes   Plan:  -Heparin 3500 units IV bolus followed by heparin infusion at 900 units/hr  -F/u 8 hr HL -Monitor daily HL, CBC and s/s of bleeding   92, PharmD., BCPS, BCCCP Clinical Pharmacist Please refer to [facility] for unit-specific pharmacist

## 2020-12-20 NOTE — Progress Notes (Addendum)
ANTICOAGULATION CONSULT NOTE   Pharmacy Consult for heparin Indication: atrial fibrillation/atrial flutter  No Known Allergies  Patient Measurements: Height: 5\' 4"  (162.6 cm) Weight: 60.9 kg (134 lb 4.8 oz) IBW/kg (Calculated) : 54.7 Heparin Dosing Weight: 60.3 kg   Vital Signs: Temp: 98 F (36.7 C) (02/18 2327) Temp Source: Oral (02/18 2327) BP: 126/81 (02/18 2327) Pulse Rate: 84 (02/18 2327)  Labs: Recent Labs    12/20/20 1214 12/20/20 1616 12/20/20 2240  HGB 14.2  --   --   HCT 45.2  --   --   PLT 199  --   --   LABPROT 12.7  --   --   INR 1.0  --   --   HEPARINUNFRC  --   --  0.89*  CREATININE 0.92  --   --   TROPONINIHS 13 16  --     Estimated Creatinine Clearance: 35.8 mL/min (by C-G formula based on SCr of 0.92 mg/dL).   Medical History: Past Medical History:  Diagnosis Date   Depression    Thyroid disease     Medications:  Medications Prior to Admission  Medication Sig Dispense Refill Last Dose   acetaminophen (TYLENOL) 500 MG tablet Take 500 mg by mouth every 6 (six) hours as needed for mild pain.   Past Week at Unknown time   Calcium Carbonate (CALTRATE 600 PO) Take 1 tablet by mouth daily.   12/19/2020 at Unknown time   cholecalciferol (VITAMIN D3) 25 MCG (1000 UNIT) tablet Take 1,000 Units by mouth daily.   12/19/2020 at Unknown time   levothyroxine (SYNTHROID) 25 MCG tablet Take 25 mcg by mouth daily.   12/19/2020 at Unknown time   sertraline (ZOLOFT) 50 MG tablet Take 50 mg by mouth daily.   12/19/2020 at Unknown time    Assessment: 75 YOF who presents with lightheadedness and near syncope found to have acute onset Afib with RVR. Pt has a new diagnosis of atrial flutter per cardiology. Pharmacy consulted to start IV heparin.   2/18 2240 heparin level 0.89 - supratherapeutic after heparin bolus 3500 units and heparin drip 900 units/hr. H/H and plt wnl. No bleeding or issues with line per RN.    Goal of Therapy:  Heparin level 0.3-0.7  units/ml Monitor platelets by anticoagulation protocol: Yes   Plan:  -Decrease heparin drip to 800 units/hr  -F/u 8 hr HL -Monitor daily HL, CBC and s/s of bleeding  -F/u with plan to transition to eliquis in AM per cardiology   3/18, PharmD Student 12/20/2020 11:43 PM  I discussed / reviewed the pharmacy note by Ms. 12/22/2020 and I agree with the students' findings and plans as documented.   Willette Alma, PharmD, BCPS Please see amion for complete clinical pharmacist phone list 12/20/2020 11:52 PM

## 2020-12-20 NOTE — ED Notes (Signed)
Dinner Order Placed 

## 2020-12-21 ENCOUNTER — Ambulatory Visit (HOSPITAL_BASED_OUTPATIENT_CLINIC_OR_DEPARTMENT_OTHER): Payer: Medicare Other

## 2020-12-21 DIAGNOSIS — I4892 Unspecified atrial flutter: Secondary | ICD-10-CM | POA: Diagnosis not present

## 2020-12-21 DIAGNOSIS — E039 Hypothyroidism, unspecified: Secondary | ICD-10-CM | POA: Diagnosis not present

## 2020-12-21 DIAGNOSIS — Z20822 Contact with and (suspected) exposure to covid-19: Secondary | ICD-10-CM | POA: Diagnosis not present

## 2020-12-21 DIAGNOSIS — I4891 Unspecified atrial fibrillation: Secondary | ICD-10-CM | POA: Diagnosis not present

## 2020-12-21 DIAGNOSIS — I483 Typical atrial flutter: Secondary | ICD-10-CM | POA: Diagnosis not present

## 2020-12-21 LAB — CBC
HCT: 40.9 % (ref 36.0–46.0)
Hemoglobin: 13.9 g/dL (ref 12.0–15.0)
MCH: 33.9 pg (ref 26.0–34.0)
MCHC: 34 g/dL (ref 30.0–36.0)
MCV: 99.8 fL (ref 80.0–100.0)
Platelets: 186 10*3/uL (ref 150–400)
RBC: 4.1 MIL/uL (ref 3.87–5.11)
RDW: 12.3 % (ref 11.5–15.5)
WBC: 6.1 10*3/uL (ref 4.0–10.5)
nRBC: 0 % (ref 0.0–0.2)

## 2020-12-21 LAB — BASIC METABOLIC PANEL
Anion gap: 11 (ref 5–15)
BUN: 16 mg/dL (ref 8–23)
CO2: 24 mmol/L (ref 22–32)
Calcium: 9.5 mg/dL (ref 8.9–10.3)
Chloride: 104 mmol/L (ref 98–111)
Creatinine, Ser: 0.82 mg/dL (ref 0.44–1.00)
GFR, Estimated: >60 mL/min (ref >60)
Glucose, Bld: 97 mg/dL (ref 70–99)
Potassium: 3.9 mmol/L (ref 3.5–5.1)
Sodium: 139 mmol/L (ref 135–145)

## 2020-12-21 LAB — T3, FREE: T3, Free: 2.8 pg/mL (ref 2.0–4.4)

## 2020-12-21 LAB — T4: T4, Total: 6.6 ug/dL (ref 4.5–12.0)

## 2020-12-21 LAB — ECHOCARDIOGRAM COMPLETE
Height: 64 in
S' Lateral: 2.9 cm
Single Plane A4C EF: 64.6 %
Weight: 2126.4 oz

## 2020-12-21 LAB — HEPARIN LEVEL (UNFRACTIONATED): Heparin Unfractionated: 0.87 IU/mL — ABNORMAL HIGH (ref 0.30–0.70)

## 2020-12-21 MED ORDER — SERTRALINE HCL 50 MG PO TABS
50.0000 mg | ORAL_TABLET | Freq: Every day | ORAL | Status: DC
  Administered 2020-12-21 – 2020-12-24 (×4): 50 mg via ORAL
  Filled 2020-12-21 (×4): qty 1

## 2020-12-21 MED ORDER — VITAMIN D 25 MCG (1000 UNIT) PO TABS
1000.0000 [IU] | ORAL_TABLET | Freq: Every day | ORAL | Status: DC
  Administered 2020-12-21 – 2020-12-24 (×4): 1000 [IU] via ORAL
  Filled 2020-12-21 (×4): qty 1

## 2020-12-21 MED ORDER — METOPROLOL TARTRATE 25 MG PO TABS
25.0000 mg | ORAL_TABLET | Freq: Two times a day (BID) | ORAL | Status: DC
  Administered 2020-12-21 – 2020-12-23 (×5): 25 mg via ORAL
  Filled 2020-12-21 (×4): qty 1

## 2020-12-21 MED ORDER — LEVOTHYROXINE SODIUM 25 MCG PO TABS
25.0000 ug | ORAL_TABLET | Freq: Every day | ORAL | Status: DC
  Administered 2020-12-21: 25 ug via ORAL
  Filled 2020-12-21: qty 1

## 2020-12-21 MED ORDER — LEVOTHYROXINE SODIUM 50 MCG PO TABS
50.0000 ug | ORAL_TABLET | Freq: Every day | ORAL | Status: DC
  Administered 2020-12-22 – 2020-12-24 (×3): 50 ug via ORAL
  Filled 2020-12-21 (×3): qty 1

## 2020-12-21 MED ORDER — APIXABAN 2.5 MG PO TABS
2.5000 mg | ORAL_TABLET | Freq: Two times a day (BID) | ORAL | Status: DC
  Administered 2020-12-21 – 2020-12-24 (×7): 2.5 mg via ORAL
  Filled 2020-12-21 (×7): qty 1

## 2020-12-21 MED ORDER — LEVOTHYROXINE SODIUM 25 MCG PO TABS
25.0000 ug | ORAL_TABLET | Freq: Once | ORAL | Status: AC
  Administered 2020-12-21: 25 ug via ORAL
  Filled 2020-12-21: qty 1

## 2020-12-21 NOTE — Care Management Obs Status (Addendum)
MEDICARE OBSERVATION STATUS NOTIFICATION   Patient Details  Name: Diana Lee MRN: [id number] Date of Birth: [birth year]   Medicare Observation Status Notification Given:  Yes  Patient asked to have CM review w daughter [name] at [phone], LVM requesting callback, not received. Reviewed form with patient in person, questions answered.     Lawerance Sabal, RN 12/21/2020, 4:57 PM

## 2020-12-21 NOTE — Progress Notes (Signed)
Progress Note  Patient Name: Diana Lee Date of Encounter: 12/21/2020  Primary Cardiologist: No primary care provider on file.   Subjective   Feels well overall but did not sleep well.  Inpatient Medications    Scheduled Meds: . cholecalciferol  1,000 Units Oral Daily  . levothyroxine  25 mcg Oral Once  . [START ON 12/22/2020] levothyroxine  50 mcg Oral Q0600  . metoprolol tartrate  25 mg Oral BID  . sertraline  50 mg Oral Daily   Continuous Infusions: . heparin 700 Units/hr (12/21/20 0935)   PRN Meds: acetaminophen, ondansetron (ZOFRAN) IV   Vital Signs    Vitals:   12/20/20 2327 12/21/20 0447 12/21/20 0735 12/21/20 0921  BP: 126/81 129/65 (!) 148/84 (!) 135/95  Pulse: 84 70 (!) 105 (!) 114  Resp: 18 15 16 16   Temp: 98 F (36.7 C) 97.9 F (36.6 C) 98.1 F (36.7 C) 98.1 F (36.7 C)  TempSrc: Oral Oral Oral   SpO2: 95% 97% 99% 100%  Weight:  60.3 kg    Height:       No intake or output data in the 24 hours ending 12/21/20 1158 Filed Weights   12/20/20 1159 12/20/20 1720 12/21/20 0447  Weight: 60.3 kg 60.9 kg 60.3 kg    Telemetry    Atrial flutter rates 90-110 - Personally Reviewed  ECG    Atrial flutter, ST changes  - Personally Reviewed  Physical Exam   GEN: No acute distress.   Neck: No JVD Cardiac: irregular rhythm, normal rate, no murmurs, rubs, or gallops.  Respiratory: Clear to auscultation bilaterally. GI: Soft, nontender, non-distended  MS: No edema; No deformity. Neuro:  Nonfocal  Psych: Normal affect   Labs    Chemistry Recent Labs  Lab 12/20/20 1214 12/21/20 0328  NA 139 139  K 4.3 3.9  CL 103 104  CO2 24 24  GLUCOSE 105* 97  BUN 20 16  CREATININE 0.92 0.82  CALCIUM 10.0 9.5  PROT 6.9  --   ALBUMIN 4.0  --   AST 39  --   ALT 25  --   ALKPHOS 63  --   BILITOT 0.8  --   GFRNONAA 60* >60  ANIONGAP 12 11     Hematology Recent Labs  Lab 12/20/20 1214 12/21/20 0328  WBC 9.4 6.1  RBC 4.37 4.10  HGB 14.2 13.9   HCT 45.2 40.9  MCV 103.4* 99.8  MCH 32.5 33.9  MCHC 31.4 34.0  RDW 12.2 12.3  PLT 199 186    Cardiac EnzymesNo results for input(s): TROPONINI in the last 168 hours. No results for input(s): TROPIPOC in the last 168 hours.   BNP Recent Labs  Lab 12/20/20 1214  BNP 238.1*     DDimer No results for input(s): DDIMER in the last 168 hours.   Radiology    DG Chest Portable 1 View  Result Date: 12/20/2020 CLINICAL DATA:  Palpitations. EXAM: PORTABLE CHEST 1 VIEW COMPARISON:  12/24/2004 FINDINGS: 1228 hours. Lungs are hyperexpanded. Interstitial markings are diffusely coarsened with chronic features. The cardiopericardial silhouette is within normal limits for size. Telemetry leads overlie the chest. IMPRESSION: Hyperexpansion with chronic interstitial coarsening. No acute cardiopulmonary findings. Electronically Signed   By: 12/26/2004 M.D.   On: 12/20/2020 12:40    Cardiac Studies   Echo formal read pending  Patient Profile     87 y.o. female with new onset atrial flutter in the setting of hypothyroidism with TSH 10.   Assessment &  Plan   Principal Problem:   Atrial flutter (HCC) Active Problems:   Hypothyroidism   Will increase dose metoprolol to 25 mg BID. Needs better rate control. May be able to go home in 1-2 days if ambulates without worsening rate control.  Will switch to eliquis, chads2vasc is 80 (age and gender). Per pharmacy dosing, but likely 5 mg BID.   Echo report pending but appears grossly unremarkable.   Recommend IM treat hypothyroidism, which is likely cause of new onset atrial flutter.   For questions or updates, please contact [facility] Please consult [email or web address] for contact info under        Signed, Parke Poisson, MD  12/21/2020, 11:58 AM

## 2020-12-21 NOTE — Progress Notes (Signed)
PROGRESS NOTE    Alvina ChouBetty Townley  ZOX:[id number] DOB: [birth year] DOA: 12/20/2020 PCP: Lupita RaiderShaw, Kimberlee, MD   Brief Narrative: 87 year old with past medical history significant for hypothyroidism who presented with complaining of near syncope.  Patient report having to a spell over the last 2 days.  He feel her heart racing and sensation of passing out.  Evaluation in the ED patient heart rate was in the 130, troponin at 13, BNP 238, TSH 10.4.  She was found to be in A. fib with RVR.   Assessment & Plan:   Principal Problem:   Atrial flutter (HCC) Active Problems:   Hypothyroidism  1-acute A. fib with RVR/a flutter On heparin drip, transition to Eliquis per cardiology Echo pending. Plan to increase metoprolol to 25 twice daily.  2-hypothyroidism: TSH 10.4.  Free T4 free T3 low normal range. I called her PCP office and her prior TSH was 10.4 and free T4 was low 0.7. Plan to increase Synthroid to 50 mcg daily.    Estimated body mass index is 22.81 kg/m as calculated from the following:   Height as of this encounter: 5\' 4"  (1.626 m).   Weight as of this encounter: 60.3 kg.   DVT prophylaxis: eliquis Code Status: Full code Family Communication: son who was at bedside Disposition Plan:  Status is: Observation  The patient remains OBS appropriate and will d/c before 2 midnights.  Dispo: The patient is from: Home              Anticipated d/c is to: Home              Anticipated d/c date is: 2 days              Patient currently is not medically stable to d/c.   Difficult to place patient No        Consultants:   Cardiology   Procedures:   ECHO  Antimicrobials:    Subjective: She denies chest pain or dyspnea. She was not able to sleep last night  Objective: Vitals:   12/21/20 0447 12/21/20 0735 12/21/20 0921 12/21/20 1220  BP: 129/65 (!) 148/84 (!) 135/95 110/84  Pulse: 70 (!) 105 (!) 114 (!) 107  Resp: 15 16 16    Temp: 97.9 F (36.6 C) 98.1 F (36.7 C)  98.1 F (36.7 C)   TempSrc: Oral Oral    SpO2: 97% 99% 100%   Weight: 60.3 kg     Height:        Intake/Output Summary (Last 24 hours) at 12/21/2020 1534 Last data filed at 12/21/2020 1234 Gross per 24 hour  Intake 166.13 ml  Output --  Net 166.13 ml   Filed Weights   12/20/20 1159 12/20/20 1720 12/21/20 0447  Weight: 60.3 kg 60.9 kg 60.3 kg    Examination:  General exam: Appears calm and comfortable  Respiratory system: Clear to auscultation. Respiratory effort normal. Cardiovascular system: S1 & S2 heard, IRR Gastrointestinal system: Abdomen is nondistended, soft and nontender. No organomegaly or masses felt. Normal bowel sounds heard. Central nervous system: Alert and oriented. No focal neurological deficits. Extremities: Symmetric 5 x 5 power.    Data Reviewed: I have personally reviewed following labs and imaging studies  CBC: Recent Labs  Lab 12/20/20 1214 12/21/20 0328  WBC 9.4 6.1  NEUTROABS 7.4  --   HGB 14.2 13.9  HCT 45.2 40.9  MCV 103.4* 99.8  PLT 199 186   Basic Metabolic Panel: Recent Labs  Lab 12/20/20 1214 12/20/20 1616  12/21/20 0328  NA 139  --  139  K 4.3  --  3.9  CL 103  --  104  CO2 24  --  24  GLUCOSE 105*  --  97  BUN 20  --  16  CREATININE 0.92  --  0.82  CALCIUM 10.0  --  9.5  MG  --  2.2  --    GFR: Estimated Creatinine Clearance: 40.2 mL/min (by C-G formula based on SCr of 0.82 mg/dL). Liver Function Tests: Recent Labs  Lab 12/20/20 1214  AST 39  ALT 25  ALKPHOS 63  BILITOT 0.8  PROT 6.9  ALBUMIN 4.0   No results for input(s): LIPASE, AMYLASE in the last 168 hours. No results for input(s): AMMONIA in the last 168 hours. Coagulation Profile: Recent Labs  Lab 12/20/20 1214  INR 1.0   Cardiac Enzymes: No results for input(s): CKTOTAL, CKMB, CKMBINDEX, TROPONINI in the last 168 hours. BNP (last 3 results) No results for input(s): PROBNP in the last 8760 hours. HbA1C: No results for input(s): HGBA1C in the last  72 hours. CBG: No results for input(s): GLUCAP in the last 168 hours. Lipid Profile: No results for input(s): CHOL, HDL, LDLCALC, TRIG, CHOLHDL, LDLDIRECT in the last 72 hours. Thyroid Function Tests: Recent Labs    12/20/20 1214 12/20/20 1350  TSH 10.459*  --   T4TOTAL  --  6.6  T3FREE  --  2.8   Anemia Panel: No results for input(s): VITAMINB12, FOLATE, FERRITIN, TIBC, IRON, RETICCTPCT in the last 72 hours. Sepsis Labs: No results for input(s): PROCALCITON, LATICACIDVEN in the last 168 hours.  Recent Results (from the past 240 hour(s))  Resp Panel by RT-PCR (Flu A&B, Covid) Nasopharyngeal Swab     Status: None   Collection Time: 12/20/20 12:14 PM   Specimen: Nasopharyngeal Swab; Nasopharyngeal(NP) swabs in vial transport medium  Result Value Ref Range Status   SARS Coronavirus 2 by RT PCR NEGATIVE NEGATIVE Final    Comment: (NOTE) SARS-CoV-2 target nucleic acids are NOT DETECTED.  The SARS-CoV-2 RNA is generally detectable in upper respiratory specimens during the acute phase of infection. The lowest concentration of SARS-CoV-2 viral copies this assay can detect is 138 copies/mL. A negative result does not preclude SARS-Cov-2 infection and should not be used as the sole basis for treatment or other patient management decisions. A negative result may occur with  improper specimen collection/handling, submission of specimen other than nasopharyngeal swab, presence of viral mutation(s) within the areas targeted by this assay, and inadequate number of viral copies(<138 copies/mL). A negative result must be combined with clinical observations, patient history, and epidemiological information. The expected result is Negative.  Fact Sheet for Patients:  BloggerCourse.com  Fact Sheet for Healthcare Providers:  SeriousBroker.it  This test is no t yet approved or cleared by the [place] FDA and  has been authorized for  detection and/or diagnosis of SARS-CoV-2 by FDA under an Emergency Use Authorization (EUA). This EUA will remain  in effect (meaning this test can be used) for the duration of the COVID-19 declaration under Section 564(b)(1) of the Act, 21 U.S.C.section 360bbb-3(b)(1), unless the authorization is terminated  or revoked sooner.       Influenza A by PCR NEGATIVE NEGATIVE Final   Influenza B by PCR NEGATIVE NEGATIVE Final    Comment: (NOTE) The Xpert Xpress SARS-CoV-2/FLU/RSV plus assay is intended as an aid in the diagnosis of influenza from Nasopharyngeal swab specimens and should not be used as a  sole basis for treatment. Nasal washings and aspirates are unacceptable for Xpert Xpress SARS-CoV-2/FLU/RSV testing.  Fact Sheet for Patients: BloggerCourse.com  Fact Sheet for Healthcare Providers: SeriousBroker.it  This test is not yet approved or cleared by the [place] FDA and has been authorized for detection and/or diagnosis of SARS-CoV-2 by FDA under an Emergency Use Authorization (EUA). This EUA will remain in effect (meaning this test can be used) for the duration of the COVID-19 declaration under Section 564(b)(1) of the Act, 21 U.S.C. section 360bbb-3(b)(1), unless the authorization is terminated or revoked.  Performed at [facility], [address], [place], [place] [address]          Radiology Studies: DG Chest Portable 1 View  Result Date: 12/20/2020 CLINICAL DATA:  Palpitations. EXAM: PORTABLE CHEST 1 VIEW COMPARISON:  12/24/2004 FINDINGS: 1228 hours. Lungs are hyperexpanded. Interstitial markings are diffusely coarsened with chronic features. The cardiopericardial silhouette is within normal limits for size. Telemetry leads overlie the chest. IMPRESSION: Hyperexpansion with chronic interstitial coarsening. No acute cardiopulmonary findings. Electronically Signed   By: Kennith Center M.D.   On: 12/20/2020  12:40   ECHOCARDIOGRAM COMPLETE  Result Date: 12/21/2020    ECHOCARDIOGRAM REPORT   Patient Name:   Diana Lee Date of Exam: 12/21/2020 Medical Rec #:  [id number]   Height:       64.0 in Accession #:    [id number]  Weight:       132.9 lb Date of Birth:  1929/04/22   BSA:          1.644 m Patient Age:    87 years    BP:           135/95 mmHg Patient Gender: F           HR:           101 bpm. Exam Location:  Inpatient Procedure: 2D Echo, Cardiac Doppler and Color Doppler Indications:    Atrial fibrillation and Flutter  History:        Patient has no prior history of Echocardiogram examinations.                 First time in hospital since 1996 for gall bladder surgery.                 Healthy 87 year-old.  Sonographer:    Roosvelt Maser RDCS Referring Phys: [id number] [facility] A SMITH  Sonographer Comments: Suboptimal apical window. IMPRESSIONS  1. Left ventricular ejection fraction, by estimation, is 60 to 65%. The left ventricle has normal function. The left ventricle has no regional wall motion abnormalities. Left ventricular diastolic function could not be evaluated.  2. Right ventricular systolic function is normal. The right ventricular size is normal. There is normal pulmonary artery systolic pressure. The estimated right ventricular systolic pressure is 28.0 mmHg.  3. The mitral valve is abnormal. Trivial mitral valve regurgitation.  4. The aortic valve is tricuspid. Aortic valve regurgitation is not visualized.  5. The inferior vena cava is normal in size with greater than 50% respiratory variability, suggesting right atrial pressure of 3 mmHg. Comparison(s): No prior Echocardiogram. FINDINGS  Left Ventricle: Left ventricular ejection fraction, by estimation, is 60 to 65%. The left ventricle has normal function. The left ventricle has no regional wall motion abnormalities. The left ventricular internal cavity size was normal in size. There is  no left ventricular hypertrophy. Left ventricular diastolic function  could not be evaluated due to atrial fibrillation. Left ventricular  diastolic function could not be evaluated. Right Ventricle: The right ventricular size is normal. No increase in right ventricular wall thickness. Right ventricular systolic function is normal. There is normal pulmonary artery systolic pressure. The tricuspid regurgitant velocity is 2.50 m/s, and  with an assumed right atrial pressure of 3 mmHg, the estimated right ventricular systolic pressure is 28.0 mmHg. Left Atrium: Left atrial size was normal in size. Right Atrium: Right atrial size was normal in size. Pericardium: There is no evidence of pericardial effusion. Mitral Valve: The mitral valve is abnormal. There is mild thickening of the mitral valve leaflet(s). Trivial mitral valve regurgitation. Tricuspid Valve: The tricuspid valve is grossly normal. Tricuspid valve regurgitation is mild. Aortic Valve: The aortic valve is tricuspid. Aortic valve regurgitation is not visualized. Pulmonic Valve: The pulmonic valve was not well visualized. Pulmonic valve regurgitation is not visualized. Aorta: The aortic root and ascending aorta are structurally normal, with no evidence of dilitation. Venous: The inferior vena cava is normal in size with greater than 50% respiratory variability, suggesting right atrial pressure of 3 mmHg. IAS/Shunts: No atrial level shunt detected by color flow Doppler.  LEFT VENTRICLE PLAX 2D LVIDd:         3.70 cm LVIDs:         2.90 cm LV PW:         0.90 cm LV IVS:        0.80 cm LVOT diam:     1.90 cm LVOT Area:     2.84 cm  LV Volumes (MOD) LV vol d, MOD A4C: 24.1 ml LV vol s, MOD A4C: 8.5 ml LV SV MOD A4C:     24.1 ml RIGHT VENTRICLE          IVC RV Basal diam:  3.90 cm  IVC diam: 2.00 cm LEFT ATRIUM           Index       RIGHT ATRIUM           Index LA diam:      2.70 cm 1.64 cm/m  RA Area:     18.60 cm LA Vol (A4C): 39.1 ml 23.78 ml/m RA Volume:   51.30 ml  31.20 ml/m   AORTA Ao Root diam: 2.40 cm Ao Asc diam:  2.40  cm TRICUSPID VALVE TR Peak grad:   25.0 mmHg TR Vmax:        250.00 cm/s  SHUNTS Systemic Diam: 1.90 cm Zoila Shutter MD Electronically signed by Zoila Shutter MD Signature Date/Time: 12/21/2020/12:59:33 PM    Final         Scheduled Meds: . apixaban  2.5 mg Oral BID  . cholecalciferol  1,000 Units Oral Daily  . [START ON 12/22/2020] levothyroxine  50 mcg Oral Q0600  . metoprolol tartrate  25 mg Oral BID  . sertraline  50 mg Oral Daily   Continuous Infusions:   LOS: 0 days    Time spent: 35 minutes    Daneesha Quinteros A Alexio Sroka, MD [facility]   If 7PM-7AM, please contact night-coverage [email or web address]  12/21/2020, 3:34 PM

## 2020-12-21 NOTE — Progress Notes (Signed)
  Echocardiogram 2D Echocardiogram has been performed.  Diana Lee F 12/21/2020, 10:38 AM

## 2020-12-21 NOTE — Progress Notes (Signed)
Call received patient with ST elevation on leads II,III, AVF per tele monitoring. . Patient resting ,denies any chest pain nor any discomforts. No changes in V/S noted Dr Sunnie Nielsen notified & called back . EKG ordered, to notify Cardiologist once EKG  obtained. Endorsed to incoming RN [employer]

## 2020-12-21 NOTE — Discharge Instructions (Signed)

## 2020-12-21 NOTE — Progress Notes (Addendum)
ANTICOAGULATION CONSULT NOTE   Pharmacy Consult for heparin Indication: atrial fibrillation/atrial flutter  No Known Allergies  Patient Measurements: Height: 5\' 4"  (162.6 cm) Weight: 60.3 kg (132 lb 14.4 oz) IBW/kg (Calculated) : 54.7 Heparin Dosing Weight: 60.3 kg   Vital Signs: Temp: 98.1 F (36.7 C) (02/19 0921) Temp Source: Oral (02/19 0735) BP: 135/95 (02/19 0921) Pulse Rate: 114 (02/19 0921)  Labs: Recent Labs    12/20/20 1214 12/20/20 1616 12/20/20 2240 12/21/20 0328 12/21/20 0731  HGB 14.2  --   --  13.9  --   HCT 45.2  --   --  40.9  --   PLT 199  --   --  186  --   LABPROT 12.7  --   --   --   --   INR 1.0  --   --   --   --   HEPARINUNFRC  --   --  0.89*  --  0.87*  CREATININE 0.92  --   --  0.82  --   TROPONINIHS 13 16  --   --   --     Estimated Creatinine Clearance: 40.2 mL/min (by C-G formula based on SCr of 0.82 mg/dL).   Medical History: Past Medical History:  Diagnosis Date  . Depression   . Thyroid disease     Medications:  Medications Prior to Admission  Medication Sig Dispense Refill Last Dose  . acetaminophen (TYLENOL) 500 MG tablet Take 500 mg by mouth every 6 (six) hours as needed for mild pain.   Past Week at Unknown time  . Calcium Carbonate (CALTRATE 600 PO) Take 1 tablet by mouth daily.   12/19/2020 at Unknown time  . cholecalciferol (VITAMIN D3) 25 MCG (1000 UNIT) tablet Take 1,000 Units by mouth daily.   12/19/2020 at Unknown time  . levothyroxine (SYNTHROID) 25 MCG tablet Take 25 mcg by mouth daily.   12/19/2020 at Unknown time  . sertraline (ZOLOFT) 50 MG tablet Take 50 mg by mouth daily.   12/19/2020 at Unknown time    Assessment: 48 YOF who presents with lightheadedness and near syncope found to have acute onset Afib with RVR. Pt has a new diagnosis of atrial flutter per cardiology. Pharmacy consulted to start IV heparin.   8 hour heparin level remains supratherapeutic at 0.87 after infusion decreased to 800 units/hour. CBC  wnl. No s/sx bleeding reported per RN.  Goal of Therapy:  Heparin level 0.3-0.7 units/ml Monitor platelets by anticoagulation protocol: Yes   Plan:  -Decrease heparin infusion to 700 units/hr  -Heparin level in 8 hours -Monitor daily HL, CBC and s/s of bleeding  -F/u with plan to transition to eliquis per cardiology   ADDENDUM: Pharmacy consulted to transition to apixaban for afib. Scr stable at 0.82. Weight 60.3 kg, stable at ~60 kg or less for patient per chart review. Recommend increasing to full dose in future if weight increases and stays > 60 kg.  Plan: Stop IV heparin Start apixaban 2.5 mg BID (weight = 60 kg and age > 74) Monitor CBC, s/sx bleeding  96, PharmD PGY-1 Pharmacy Resident 12/21/2020 12:16 PM Please see AMION for all pharmacy numbers

## 2020-12-22 ENCOUNTER — Encounter (HOSPITAL_COMMUNITY): Payer: Self-pay | Admitting: Internal Medicine

## 2020-12-22 DIAGNOSIS — I4891 Unspecified atrial fibrillation: Secondary | ICD-10-CM | POA: Diagnosis not present

## 2020-12-22 DIAGNOSIS — F32A Depression, unspecified: Secondary | ICD-10-CM | POA: Diagnosis not present

## 2020-12-22 DIAGNOSIS — R55 Syncope and collapse: Secondary | ICD-10-CM | POA: Diagnosis not present

## 2020-12-22 DIAGNOSIS — Z79899 Other long term (current) drug therapy: Secondary | ICD-10-CM | POA: Diagnosis not present

## 2020-12-22 DIAGNOSIS — R001 Bradycardia, unspecified: Secondary | ICD-10-CM | POA: Diagnosis not present

## 2020-12-22 DIAGNOSIS — Z79890 Hormone replacement therapy (postmenopausal): Secondary | ICD-10-CM | POA: Diagnosis not present

## 2020-12-22 DIAGNOSIS — I9589 Other hypotension: Secondary | ICD-10-CM | POA: Diagnosis not present

## 2020-12-22 DIAGNOSIS — T461X5A Adverse effect of calcium-channel blockers, initial encounter: Secondary | ICD-10-CM | POA: Diagnosis not present

## 2020-12-22 DIAGNOSIS — Z20822 Contact with and (suspected) exposure to covid-19: Secondary | ICD-10-CM | POA: Diagnosis not present

## 2020-12-22 DIAGNOSIS — I4892 Unspecified atrial flutter: Secondary | ICD-10-CM | POA: Diagnosis not present

## 2020-12-22 DIAGNOSIS — I483 Typical atrial flutter: Secondary | ICD-10-CM | POA: Diagnosis not present

## 2020-12-22 DIAGNOSIS — E039 Hypothyroidism, unspecified: Secondary | ICD-10-CM | POA: Diagnosis not present

## 2020-12-22 LAB — CBC
HCT: 44.1 % (ref 36.0–46.0)
Hemoglobin: 14.3 g/dL (ref 12.0–15.0)
MCH: 32.4 pg (ref 26.0–34.0)
MCHC: 32.4 g/dL (ref 30.0–36.0)
MCV: 99.8 fL (ref 80.0–100.0)
Platelets: 204 10*3/uL (ref 150–400)
RBC: 4.42 MIL/uL (ref 3.87–5.11)
RDW: 12.1 % (ref 11.5–15.5)
WBC: 6.7 10*3/uL (ref 4.0–10.5)
nRBC: 0 % (ref 0.0–0.2)

## 2020-12-22 MED ORDER — DILTIAZEM HCL-DEXTROSE 125-5 MG/125ML-% IV SOLN (PREMIX)
5.0000 mg/h | INTRAVENOUS | Status: DC
  Administered 2020-12-22: 5 mg/h via INTRAVENOUS
  Filled 2020-12-22: qty 125

## 2020-12-22 NOTE — Progress Notes (Addendum)
BP dropped to 86/45 and HR went down to 50s.  Turned off cardizem drip and rechecked BP and was 78/59.  Still in Afib. Cardiology made aware.  Hinton Dyer, RN

## 2020-12-22 NOTE — Progress Notes (Signed)
BP rechecked and went up to 105/58 with HR in 60s.  Hinton Dyer, RN

## 2020-12-22 NOTE — Progress Notes (Signed)
Progress Note  Patient Name: Diana Lee Date of Encounter: 12/22/2020  Primary Cardiologist: No primary care provider on file.   Subjective   Does not feel well today. Rates 130s  Inpatient Medications    Scheduled Meds: . apixaban  2.5 mg Oral BID  . cholecalciferol  1,000 Units Oral Daily  . levothyroxine  50 mcg Oral Q0600  . metoprolol tartrate  25 mg Oral BID  . sertraline  50 mg Oral Daily   Continuous Infusions:  PRN Meds: acetaminophen, ondansetron (ZOFRAN) IV   Vital Signs    Vitals:   12/21/20 0921 12/21/20 1220 12/21/20 1759 12/22/20 0458  BP: (!) 135/95 110/84 126/85 129/70  Pulse: (!) 114 (!) 107 85 71  Resp: 16 16 18 18   Temp: 98.1 F (36.7 C)  98 F (36.7 C) 98.2 F (36.8 C)  TempSrc:   Oral Oral  SpO2: 100% 97% 99% 96%  Weight:    61.2 kg  Height:        Intake/Output Summary (Last 24 hours) at 12/22/2020 0850 Last data filed at 12/21/2020 1234 Gross per 24 hour  Intake 166.13 ml  Output -  Net 166.13 ml   Filed Weights   12/20/20 1720 12/21/20 0447 12/22/20 0458  Weight: 60.9 kg 60.3 kg 61.2 kg    Telemetry    Atrial flutter rate 130- Personally Reviewed  ECG    Atrial flutter, ST changes  - Personally Reviewed  Physical Exam   GEN: washed out   Neck: No JVD Cardiac: regular tachycardia, no murmurs, rubs, or gallops.  Respiratory: Clear to auscultation bilaterally. GI: Soft, nontender, non-distended  MS: No edema; No deformity. Neuro:  Nonfocal  Psych: Normal affect   Labs    Chemistry Recent Labs  Lab 12/20/20 1214 12/21/20 0328  NA 139 139  K 4.3 3.9  CL 103 104  CO2 24 24  GLUCOSE 105* 97  BUN 20 16  CREATININE 0.92 0.82  CALCIUM 10.0 9.5  PROT 6.9  --   ALBUMIN 4.0  --   AST 39  --   ALT 25  --   ALKPHOS 63  --   BILITOT 0.8  --   GFRNONAA 60* >60  ANIONGAP 12 11     Hematology Recent Labs  Lab 12/20/20 1214 12/21/20 0328 12/22/20 0533  WBC 9.4 6.1 6.7  RBC 4.37 4.10 4.42  HGB 14.2 13.9  14.3  HCT 45.2 40.9 44.1  MCV 103.4* 99.8 99.8  MCH 32.5 33.9 32.4  MCHC 31.4 34.0 32.4  RDW 12.2 12.3 12.1  PLT 199 186 204    Cardiac EnzymesNo results for input(s): TROPONINI in the last 168 hours. No results for input(s): TROPIPOC in the last 168 hours.   BNP Recent Labs  Lab 12/20/20 1214  BNP 238.1*     DDimer No results for input(s): DDIMER in the last 168 hours.   Radiology    DG Chest Portable 1 View  Result Date: 12/20/2020 CLINICAL DATA:  Palpitations. EXAM: PORTABLE CHEST 1 VIEW COMPARISON:  12/24/2004 FINDINGS: 1228 hours. Lungs are hyperexpanded. Interstitial markings are diffusely coarsened with chronic features. The cardiopericardial silhouette is within normal limits for size. Telemetry leads overlie the chest. IMPRESSION: Hyperexpansion with chronic interstitial coarsening. No acute cardiopulmonary findings. Electronically Signed   By: 12/26/2004 M.D.   On: 12/20/2020 12:40   ECHOCARDIOGRAM COMPLETE  Result Date: 12/21/2020    ECHOCARDIOGRAM REPORT   Patient Name:   Diana Lee Date of Exam: 12/21/2020  Medical Rec #:  [id number]   Height:       64.0 in Accession #:    [id number]  Weight:       132.9 lb Date of Birth:  01-15-1929   BSA:          1.644 m Patient Age:    87 years    BP:           135/95 mmHg Patient Gender: F           HR:           101 bpm. Exam Location:  Inpatient Procedure: 2D Echo, Cardiac Doppler and Color Doppler Indications:    Atrial fibrillation and Flutter  History:        Patient has no prior history of Echocardiogram examinations.                 First time in hospital since 1996 for gall bladder surgery.                 Healthy 87 year-old.  Sonographer:    Roosvelt Maser RDCS Referring Phys: [id number] [facility] A SMITH  Sonographer Comments: Suboptimal apical window. IMPRESSIONS  1. Left ventricular ejection fraction, by estimation, is 60 to 65%. The left ventricle has normal function. The left ventricle has no regional wall motion abnormalities.  Left ventricular diastolic function could not be evaluated.  2. Right ventricular systolic function is normal. The right ventricular size is normal. There is normal pulmonary artery systolic pressure. The estimated right ventricular systolic pressure is 28.0 mmHg.  3. The mitral valve is abnormal. Trivial mitral valve regurgitation.  4. The aortic valve is tricuspid. Aortic valve regurgitation is not visualized.  5. The inferior vena cava is normal in size with greater than 50% respiratory variability, suggesting right atrial pressure of 3 mmHg. Comparison(s): No prior Echocardiogram. FINDINGS  Left Ventricle: Left ventricular ejection fraction, by estimation, is 60 to 65%. The left ventricle has normal function. The left ventricle has no regional wall motion abnormalities. The left ventricular internal cavity size was normal in size. There is  no left ventricular hypertrophy. Left ventricular diastolic function could not be evaluated due to atrial fibrillation. Left ventricular diastolic function could not be evaluated. Right Ventricle: The right ventricular size is normal. No increase in right ventricular wall thickness. Right ventricular systolic function is normal. There is normal pulmonary artery systolic pressure. The tricuspid regurgitant velocity is 2.50 m/s, and  with an assumed right atrial pressure of 3 mmHg, the estimated right ventricular systolic pressure is 28.0 mmHg. Left Atrium: Left atrial size was normal in size. Right Atrium: Right atrial size was normal in size. Pericardium: There is no evidence of pericardial effusion. Mitral Valve: The mitral valve is abnormal. There is mild thickening of the mitral valve leaflet(s). Trivial mitral valve regurgitation. Tricuspid Valve: The tricuspid valve is grossly normal. Tricuspid valve regurgitation is mild. Aortic Valve: The aortic valve is tricuspid. Aortic valve regurgitation is not visualized. Pulmonic Valve: The pulmonic valve was not well visualized.  Pulmonic valve regurgitation is not visualized. Aorta: The aortic root and ascending aorta are structurally normal, with no evidence of dilitation. Venous: The inferior vena cava is normal in size with greater than 50% respiratory variability, suggesting right atrial pressure of 3 mmHg. IAS/Shunts: No atrial level shunt detected by color flow Doppler.  LEFT VENTRICLE PLAX 2D LVIDd:         3.70 cm LVIDs:  2.90 cm LV PW:         0.90 cm LV IVS:        0.80 cm LVOT diam:     1.90 cm LVOT Area:     2.84 cm  LV Volumes (MOD) LV vol d, MOD A4C: 24.1 ml LV vol s, MOD A4C: 8.5 ml LV SV MOD A4C:     24.1 ml RIGHT VENTRICLE          IVC RV Basal diam:  3.90 cm  IVC diam: 2.00 cm LEFT ATRIUM           Index       RIGHT ATRIUM           Index LA diam:      2.70 cm 1.64 cm/m  RA Area:     18.60 cm LA Vol (A4C): 39.1 ml 23.78 ml/m RA Volume:   51.30 ml  31.20 ml/m   AORTA Ao Root diam: 2.40 cm Ao Asc diam:  2.40 cm TRICUSPID VALVE TR Peak grad:   25.0 mmHg TR Vmax:        250.00 cm/s  SHUNTS Systemic Diam: 1.90 cm Zoila Shutter MD Electronically signed by Zoila Shutter MD Signature Date/Time: 12/21/2020/12:59:33 PM    Final     Cardiac Studies   Normal echo  Patient Profile     87 y.o. female with new onset atrial flutter in the setting of hypothyroidism with TSH 10.   Assessment & Plan   Principal Problem:   Atrial flutter (HCC) Active Problems:   Hypothyroidism   Rates worse today after her bath.  -Would continue metoprolol tartrate 25 mg BID, if BP tolerates can change to 25 mg TID. -will start diltiazem IV for better rate control to improve symptoms. - with suboptimal rate control, may want to consider TEE cardioversion this admission. No signs of HF. Would see if we can rate control first. I am available for TEE/DCCV on Wednesday, as her new cardiologist would be happy to do this at that time if indicated. Rates are likely driven by hypothyroidism.   Eliquis per pharmacy - they have added  eliquis 2.5 mg BID.  Recommend IM treat hypothyroidism, which is likely cause of new onset atrial flutter. Levothyroxine has been increased to 50 mcg. (TSH 10).  For questions or updates, please contact [facility] Please consult [email or web address] for contact info under        Signed, Parke Poisson, MD  12/22/2020, 8:50 AM

## 2020-12-22 NOTE — Progress Notes (Signed)
PROGRESS NOTE    Diana Lee  TFT:[id number] DOB: 05-30-1929 DOA: 12/20/2020 PCP: [name], MD   Brief Narrative: 87 year old with past medical history significant for hypothyroidism who presented with complaining of near syncope.  Patient report having to a spell over the last 2 days.  He feel her heart racing and sensation of passing out.  Evaluation in the ED patient heart rate was in the 130, troponin at 13, BNP 238, TSH 10.4.  She was found to be in A. fib with RVR.   Assessment & Plan:   Principal Problem:   Atrial flutter (HCC) Active Problems:   Hypothyroidism  1-Acute A. fib with RVR/a flutter On heparin drip, transition to Eliquis per cardiology Echo no significant valve abnormalities.  On Metoprolol BID>  HR elevated this am, started on Cardizem Gtt Per cardiology, might need to consider TEE/Cardioversion this admission.   2-Hypothyroidism: TSH 10.4.  Free T4 free T3 low normal range. I called her PCP office and her prior TSH was 10.4 and free T4 was low 0.7. Synthroid increase  to 50 mcg daily during this hospitalization.     Estimated body mass index is 23.16 kg/m as calculated from the following:   Height as of this encounter: 5\' 4"  (1.626 m).   Weight as of this encounter: 61.2 kg.   DVT prophylaxis: eliquis Code Status: Full code Family Communication: son who was at bedside Disposition Plan:  Status is: Observation  The patient remains OBS appropriate and will d/c before 2 midnights.  Dispo: The patient is from: Home              Anticipated d/c is to: Home              Anticipated d/c date is: 2 days              Patient currently is not medically stable to d/c.   Difficult to place patient No        Consultants:   Cardiology   Procedures:   ECHO  Antimicrobials:    Subjective: HR up today.  Denies dyspnea, chest pain.   Objective: Vitals:   12/21/20 1220 12/21/20 1759 12/22/20 0458 12/22/20 0921  BP: 110/84 126/85  129/70 140/85  Pulse: (!) 107 85 71 (!) 133  Resp: 16 18 18    Temp:  98 F (36.7 C) 98.2 F (36.8 C)   TempSrc:  Oral Oral   SpO2: 97% 99% 96%   Weight:   61.2 kg   Height:        Intake/Output Summary (Last 24 hours) at 12/22/2020 Last data filed at 12/21/2020 1234 Gross per 24 hour  Intake 166.13 ml  Output -  Net 166.13 ml   Filed Weights   12/20/20 1720 12/21/20 0447 12/22/20 0458  Weight: 60.9 kg 60.3 kg 61.2 kg    Examination:  General exam: NAD Respiratory system: CTA Cardiovascular system: S 1, S 2 IRR Gastrointestinal system: BS present, soft, nt Central nervous system: Alert, conversant.  Extremities: Symmetric power.     Data Reviewed: I have personally reviewed following labs and imaging studies  CBC: Recent Labs  Lab 12/20/20 1214 12/21/20 0328 12/22/20 0533  WBC 9.4 6.1 6.7  NEUTROABS 7.4  --   --   HGB 14.2 13.9 14.3  HCT 45.2 40.9 44.1  MCV 103.4* 99.8 99.8  PLT 199 186 204   Basic Metabolic Panel: Recent Labs  Lab 12/20/20 1214 12/20/20 1616 12/21/20 0328  NA 139  --  139  K 4.3  --  3.9  CL 103  --  104  CO2 24  --  24  GLUCOSE 105*  --  97  BUN 20  --  16  CREATININE 0.92  --  0.82  CALCIUM 10.0  --  9.5  MG  --  2.2  --    GFR: Estimated Creatinine Clearance: 40.2 mL/min (by C-G formula based on SCr of 0.82 mg/dL). Liver Function Tests: Recent Labs  Lab 12/20/20 1214  AST 39  ALT 25  ALKPHOS 63  BILITOT 0.8  PROT 6.9  ALBUMIN 4.0   No results for input(s): LIPASE, AMYLASE in the last 168 hours. No results for input(s): AMMONIA in the last 168 hours. Coagulation Profile: Recent Labs  Lab 12/20/20 1214  INR 1.0   Cardiac Enzymes: No results for input(s): CKTOTAL, CKMB, CKMBINDEX, TROPONINI in the last 168 hours. BNP (last 3 results) No results for input(s): PROBNP in the last 8760 hours. HbA1C: No results for input(s): HGBA1C in the last 72 hours. CBG: No results for input(s): GLUCAP in the last 168  hours. Lipid Profile: No results for input(s): CHOL, HDL, LDLCALC, TRIG, CHOLHDL, LDLDIRECT in the last 72 hours. Thyroid Function Tests: Recent Labs    12/20/20 1214 12/20/20 1350  TSH 10.459*  --   T4TOTAL  --  6.6  T3FREE  --  2.8   Anemia Panel: No results for input(s): VITAMINB12, FOLATE, FERRITIN, TIBC, IRON, RETICCTPCT in the last 72 hours. Sepsis Labs: No results for input(s): PROCALCITON, LATICACIDVEN in the last 168 hours.  Recent Results (from the past 240 hour(s))  Resp Panel by RT-PCR (Flu A&B, Covid) Nasopharyngeal Swab     Status: None   Collection Time: 12/20/20 12:14 PM   Specimen: Nasopharyngeal Swab; Nasopharyngeal(NP) swabs in vial transport medium  Result Value Ref Range Status   SARS Coronavirus 2 by RT PCR NEGATIVE NEGATIVE Final    Comment: (NOTE) SARS-CoV-2 target nucleic acids are NOT DETECTED.  The SARS-CoV-2 RNA is generally detectable in upper respiratory specimens during the acute phase of infection. The lowest concentration of SARS-CoV-2 viral copies this assay can detect is 138 copies/mL. A negative result does not preclude SARS-Cov-2 infection and should not be used as the sole basis for treatment or other patient management decisions. A negative result may occur with  improper specimen collection/handling, submission of specimen other than nasopharyngeal swab, presence of viral mutation(s) within the areas targeted by this assay, and inadequate number of viral copies(<138 copies/mL). A negative result must be combined with clinical observations, patient history, and epidemiological information. The expected result is Negative.  Fact Sheet for Patients:  BloggerCourse.com  Fact Sheet for Healthcare Providers:  SeriousBroker.it  This test is no t yet approved or cleared by the [place] FDA and  has been authorized for detection and/or diagnosis of SARS-CoV-2 by FDA under an Emergency  Use Authorization (EUA). This EUA will remain  in effect (meaning this test can be used) for the duration of the COVID-19 declaration under Section 564(b)(1) of the Act, 21 U.S.C.section 360bbb-3(b)(1), unless the authorization is terminated  or revoked sooner.       Influenza A by PCR NEGATIVE NEGATIVE Final   Influenza B by PCR NEGATIVE NEGATIVE Final    Comment: (NOTE) The Xpert Xpress SARS-CoV-2/FLU/RSV plus assay is intended as an aid in the diagnosis of influenza from Nasopharyngeal swab specimens and should not be used as a sole basis for treatment. Nasal washings and aspirates  are unacceptable for Xpert Xpress SARS-CoV-2/FLU/RSV testing.  Fact Sheet for Patients: BloggerCourse.com  Fact Sheet for Healthcare Providers: SeriousBroker.it  This test is not yet approved or cleared by the [place] FDA and has been authorized for detection and/or diagnosis of SARS-CoV-2 by FDA under an Emergency Use Authorization (EUA). This EUA will remain in effect (meaning this test can be used) for the duration of the COVID-19 declaration under Section 564(b)(1) of the Act, 21 U.S.C. section 360bbb-3(b)(1), unless the authorization is terminated or revoked.  Performed at [facility], [address] [place], [place] [address]          Radiology Studies: DG Chest Portable 1 View  Result Date: 12/20/2020 CLINICAL DATA:  Palpitations. EXAM: PORTABLE CHEST 1 VIEW COMPARISON:  12/24/2004 FINDINGS: 1228 hours. Lungs are hyperexpanded. Interstitial markings are diffusely coarsened with chronic features. The cardiopericardial silhouette is within normal limits for size. Telemetry leads overlie the chest. IMPRESSION: Hyperexpansion with chronic interstitial coarsening. No acute cardiopulmonary findings. Electronically Signed   By: Kennith Center M.D.   On: 12/20/2020 12:40   ECHOCARDIOGRAM COMPLETE  Result Date: 12/21/2020     ECHOCARDIOGRAM REPORT   Patient Name:   Diana Lee Date of Exam: 12/21/2020 Medical Rec #:  [id number]   Height:       64.0 in Accession #:    [id number]  Weight:       132.9 lb Date of Birth:  01/17/1929   BSA:          1.644 m Patient Age:    89 years    BP:           135/95 mmHg Patient Gender: F           HR:           101 bpm. Exam Location:  Inpatient Procedure: 2D Echo, Cardiac Doppler and Color Doppler Indications:    Atrial fibrillation and Flutter  History:        Patient has no prior history of Echocardiogram examinations.                 First time in hospital since 1996 for gall bladder surgery.                 Healthy 87 year-old.  Sonographer:    Roosvelt Maser RDCS Referring Phys: [id number] [facility] A SMITH  Sonographer Comments: Suboptimal apical window. IMPRESSIONS  1. Left ventricular ejection fraction, by estimation, is 60 to 65%. The left ventricle has normal function. The left ventricle has no regional wall motion abnormalities. Left ventricular diastolic function could not be evaluated.  2. Right ventricular systolic function is normal. The right ventricular size is normal. There is normal pulmonary artery systolic pressure. The estimated right ventricular systolic pressure is 28.0 mmHg.  3. The mitral valve is abnormal. Trivial mitral valve regurgitation.  4. The aortic valve is tricuspid. Aortic valve regurgitation is not visualized.  5. The inferior vena cava is normal in size with greater than 50% respiratory variability, suggesting right atrial pressure of 3 mmHg. Comparison(s): No prior Echocardiogram. FINDINGS  Left Ventricle: Left ventricular ejection fraction, by estimation, is 60 to 65%. The left ventricle has normal function. The left ventricle has no regional wall motion abnormalities. The left ventricular internal cavity size was normal in size. There is  no left ventricular hypertrophy. Left ventricular diastolic function could not be evaluated due to atrial fibrillation. Left  ventricular diastolic function could not be evaluated. Right Ventricle:  The right ventricular size is normal. No increase in right ventricular wall thickness. Right ventricular systolic function is normal. There is normal pulmonary artery systolic pressure. The tricuspid regurgitant velocity is 2.50 m/s, and  with an assumed right atrial pressure of 3 mmHg, the estimated right ventricular systolic pressure is 28.0 mmHg. Left Atrium: Left atrial size was normal in size. Right Atrium: Right atrial size was normal in size. Pericardium: There is no evidence of pericardial effusion. Mitral Valve: The mitral valve is abnormal. There is mild thickening of the mitral valve leaflet(s). Trivial mitral valve regurgitation. Tricuspid Valve: The tricuspid valve is grossly normal. Tricuspid valve regurgitation is mild. Aortic Valve: The aortic valve is tricuspid. Aortic valve regurgitation is not visualized. Pulmonic Valve: The pulmonic valve was not well visualized. Pulmonic valve regurgitation is not visualized. Aorta: The aortic root and ascending aorta are structurally normal, with no evidence of dilitation. Venous: The inferior vena cava is normal in size with greater than 50% respiratory variability, suggesting right atrial pressure of 3 mmHg. IAS/Shunts: No atrial level shunt detected by color flow Doppler.  LEFT VENTRICLE PLAX 2D LVIDd:         3.70 cm LVIDs:         2.90 cm LV PW:         0.90 cm LV IVS:        0.80 cm LVOT diam:     1.90 cm LVOT Area:     2.84 cm  LV Volumes (MOD) LV vol d, MOD A4C: 24.1 ml LV vol s, MOD A4C: 8.5 ml LV SV MOD A4C:     24.1 ml RIGHT VENTRICLE          IVC RV Basal diam:  3.90 cm  IVC diam: 2.00 cm LEFT ATRIUM           Index       RIGHT ATRIUM           Index LA diam:      2.70 cm 1.64 cm/m  RA Area:     18.60 cm LA Vol (A4C): 39.1 ml 23.78 ml/m RA Volume:   51.30 ml  31.20 ml/m   AORTA Ao Root diam: 2.40 cm Ao Asc diam:  2.40 cm TRICUSPID VALVE TR Peak grad:   25.0 mmHg TR Vmax:         250.00 cm/s  SHUNTS Systemic Diam: 1.90 cm Zoila ShutterKenneth Hilty MD Electronically signed by Zoila ShutterKenneth Hilty MD Signature Date/Time: 12/21/2020/12:59:33 PM    Final         Scheduled Meds: . apixaban  2.5 mg Oral BID  . cholecalciferol  1,000 Units Oral Daily  . levothyroxine  50 mcg Oral Q0600  . metoprolol tartrate  25 mg Oral BID  . sertraline  50 mg Oral Daily   Continuous Infusions: . diltiazem (CARDIZEM) infusion 5 mg/hr (12/22/20 0920)     LOS: 0 days    Time spent: 35 minutes    Marveline Profeta A Iley Breeden, MD [facility]   If 7PM-7AM, please contact night-coverage [email or web address]  12/22/2020, 9:23 AM

## 2020-12-23 DIAGNOSIS — E039 Hypothyroidism, unspecified: Secondary | ICD-10-CM | POA: Diagnosis not present

## 2020-12-23 DIAGNOSIS — I483 Typical atrial flutter: Secondary | ICD-10-CM | POA: Diagnosis not present

## 2020-12-23 LAB — CBC
HCT: 39.2 % (ref 36.0–46.0)
Hemoglobin: 13.2 g/dL (ref 12.0–15.0)
MCH: 33.6 pg (ref 26.0–34.0)
MCHC: 33.7 g/dL (ref 30.0–36.0)
MCV: 99.7 fL (ref 80.0–100.0)
Platelets: 182 10*3/uL (ref 150–400)
RBC: 3.93 MIL/uL (ref 3.87–5.11)
RDW: 12.3 % (ref 11.5–15.5)
WBC: 6.8 10*3/uL (ref 4.0–10.5)
nRBC: 0 % (ref 0.0–0.2)

## 2020-12-23 LAB — BASIC METABOLIC PANEL
Anion gap: 13 (ref 5–15)
BUN: 20 mg/dL (ref 8–23)
CO2: 22 mmol/L (ref 22–32)
Calcium: 9.5 mg/dL (ref 8.9–10.3)
Chloride: 104 mmol/L (ref 98–111)
Creatinine, Ser: 0.89 mg/dL (ref 0.44–1.00)
GFR, Estimated: >60 mL/min (ref >60)
Glucose, Bld: 100 mg/dL — ABNORMAL HIGH (ref 70–99)
Potassium: 4.2 mmol/L (ref 3.5–5.1)
Sodium: 139 mmol/L (ref 135–145)

## 2020-12-23 LAB — MAGNESIUM: Magnesium: 2 mg/dL (ref 1.7–2.4)

## 2020-12-23 MED ORDER — METOPROLOL TARTRATE 25 MG PO TABS
37.5000 mg | ORAL_TABLET | Freq: Two times a day (BID) | ORAL | Status: DC
  Administered 2020-12-23: 37.5 mg via ORAL
  Filled 2020-12-23 (×2): qty 1

## 2020-12-23 NOTE — Progress Notes (Signed)
Progress Note  Patient Name: Diana Lee Date of Encounter: 12/23/2020  Primary Cardiologist: Dr. Jacques Navy, MD   Subjective   Feels food today. HR remains elevated in the 110-130 range  Inpatient Medications    Scheduled Meds: . apixaban  2.5 mg Oral BID  . cholecalciferol  1,000 Units Oral Daily  . levothyroxine  50 mcg Oral Q0600  . metoprolol tartrate  25 mg Oral BID  . sertraline  50 mg Oral Daily   Continuous Infusions: . diltiazem (CARDIZEM) infusion Stopped (12/22/20 1140)   PRN Meds: acetaminophen, ondansetron (ZOFRAN) IV   Vital Signs    Vitals:   12/22/20 1552 12/22/20 1750 12/22/20 2009 12/23/20 0626  BP: 112/61 (!) 108/58 109/68 127/73  Pulse: 93  89 91  Resp: 16 16 16 15   Temp: 97.8 F (36.6 C) 97.7 F (36.5 C) 98.4 F (36.9 C) 98.3 F (36.8 C)  TempSrc:  Oral  Oral  SpO2: 99% 99% 97% 98%  Weight:    60.5 kg  Height:        Intake/Output Summary (Last 24 hours) at 12/23/2020 0958 Last data filed at 12/22/2020 1855 Gross per 24 hour  Intake 495.6 ml  Output -  Net 495.6 ml   Filed Weights   12/21/20 0447 12/22/20 0458 12/23/20 0626  Weight: 60.3 kg 61.2 kg 60.5 kg    Physical Exam   General: Elderly,  NAD Neck: Negative for carotid bruits. No JVD Lungs:Clear to ausculation bilaterally. No wheezes, rales, or rhonchi. Breathing is unlabored. Cardiovascular: Irregularly irregular. No murmurs Abdomen: Soft, non-tender, non-distended. No obvious abdominal masses. Extremities: No edema. Radial pulses 2+ bilaterally Neuro: Alert and oriented. No focal deficits. No facial asymmetry. MAE spontaneously. Psych: Responds to questions appropriately with normal affect.    Labs    Chemistry Recent Labs  Lab 12/20/20 1214 12/21/20 0328 12/23/20 0235  NA 139 139 139  K 4.3 3.9 4.2  CL 103 104 104  CO2 24 24 22   GLUCOSE 105* 97 100*  BUN 20 16 20   CREATININE 0.92 0.82 0.89  CALCIUM 10.0 9.5 9.5  PROT 6.9  --   --   ALBUMIN 4.0  --   --    AST 39  --   --   ALT 25  --   --   ALKPHOS 63  --   --   BILITOT 0.8  --   --   GFRNONAA 60* >60 >60  ANIONGAP 12 11 13      Hematology Recent Labs  Lab 12/21/20 0328 12/22/20 0533 12/23/20 0235  WBC 6.1 6.7 6.8  RBC 4.10 4.42 3.93  HGB 13.9 14.3 13.2  HCT 40.9 44.1 39.2  MCV 99.8 99.8 99.7  MCH 33.9 32.4 33.6  MCHC 34.0 32.4 33.7  RDW 12.3 12.1 12.3  PLT 186 204 182    Cardiac EnzymesNo results for input(s): TROPONINI in the last 168 hours. No results for input(s): TROPIPOC in the last 168 hours.   BNP Recent Labs  Lab 12/20/20 1214  BNP 238.1*     DDimer No results for input(s): DDIMER in the last 168 hours.   Radiology    ECHOCARDIOGRAM COMPLETE  Result Date: 12/21/2020    ECHOCARDIOGRAM REPORT   Patient Name:   Diana Lee Date of Exam: 12/21/2020 Medical Rec #:  12/22/20   Height:       64.0 in Accession #:    [id number]  Weight:       132.9 lb Date  of Birth:  09-Jul-1929   BSA:          1.644 m Patient Age:    87 years    BP:           135/95 mmHg Patient Gender: F           HR:           101 bpm. Exam Location:  Inpatient Procedure: 2D Echo, Cardiac Doppler and Color Doppler Indications:    Atrial fibrillation and Flutter  History:        Patient has no prior history of Echocardiogram examinations.                 First time in hospital since 1996 for gall bladder surgery.                 Healthy 87 year-old.  Sonographer:    Roosvelt Maser RDCS Referring Phys: [id number] [facility] A SMITH  Sonographer Comments: Suboptimal apical window. IMPRESSIONS  1. Left ventricular ejection fraction, by estimation, is 60 to 65%. The left ventricle has normal function. The left ventricle has no regional wall motion abnormalities. Left ventricular diastolic function could not be evaluated.  2. Right ventricular systolic function is normal. The right ventricular size is normal. There is normal pulmonary artery systolic pressure. The estimated right ventricular systolic pressure is 28.0  mmHg.  3. The mitral valve is abnormal. Trivial mitral valve regurgitation.  4. The aortic valve is tricuspid. Aortic valve regurgitation is not visualized.  5. The inferior vena cava is normal in size with greater than 50% respiratory variability, suggesting right atrial pressure of 3 mmHg. Comparison(s): No prior Echocardiogram. FINDINGS  Left Ventricle: Left ventricular ejection fraction, by estimation, is 60 to 65%. The left ventricle has normal function. The left ventricle has no regional wall motion abnormalities. The left ventricular internal cavity size was normal in size. There is  no left ventricular hypertrophy. Left ventricular diastolic function could not be evaluated due to atrial fibrillation. Left ventricular diastolic function could not be evaluated. Right Ventricle: The right ventricular size is normal. No increase in right ventricular wall thickness. Right ventricular systolic function is normal. There is normal pulmonary artery systolic pressure. The tricuspid regurgitant velocity is 2.50 m/s, and  with an assumed right atrial pressure of 3 mmHg, the estimated right ventricular systolic pressure is 28.0 mmHg. Left Atrium: Left atrial size was normal in size. Right Atrium: Right atrial size was normal in size. Pericardium: There is no evidence of pericardial effusion. Mitral Valve: The mitral valve is abnormal. There is mild thickening of the mitral valve leaflet(s). Trivial mitral valve regurgitation. Tricuspid Valve: The tricuspid valve is grossly normal. Tricuspid valve regurgitation is mild. Aortic Valve: The aortic valve is tricuspid. Aortic valve regurgitation is not visualized. Pulmonic Valve: The pulmonic valve was not well visualized. Pulmonic valve regurgitation is not visualized. Aorta: The aortic root and ascending aorta are structurally normal, with no evidence of dilitation. Venous: The inferior vena cava is normal in size with greater than 50% respiratory variability, suggesting  right atrial pressure of 3 mmHg. IAS/Shunts: No atrial level shunt detected by color flow Doppler.  LEFT VENTRICLE PLAX 2D LVIDd:         3.70 cm LVIDs:         2.90 cm LV PW:         0.90 cm LV IVS:        0.80 cm LVOT diam:     1.90  cm LVOT Area:     2.84 cm  LV Volumes (MOD) LV vol d, MOD A4C: 24.1 ml LV vol s, MOD A4C: 8.5 ml LV SV MOD A4C:     24.1 ml RIGHT VENTRICLE          IVC RV Basal diam:  3.90 cm  IVC diam: 2.00 cm LEFT ATRIUM           Index       RIGHT ATRIUM           Index LA diam:      2.70 cm 1.64 cm/m  RA Area:     18.60 cm LA Vol (A4C): 39.1 ml 23.78 ml/m RA Volume:   51.30 ml  31.20 ml/m   AORTA Ao Root diam: 2.40 cm Ao Asc diam:  2.40 cm TRICUSPID VALVE TR Peak grad:   25.0 mmHg TR Vmax:        250.00 cm/s  SHUNTS Systemic Diam: 1.90 cm Zoila Shutter MD Electronically signed by Zoila Shutter MD Signature Date/Time: 12/21/2020/12:59:33 PM    Final    Telemetry    12/23/20 AF with rates in the 110-130 range  - Personally Reviewed  ECG    No new tracing as of 12/23/20 - Personally Reviewed  Cardiac Studies   Echo 12/21/20:  1. Left ventricular ejection fraction, by estimation, is 60 to 65%. The  left ventricle has normal function. The left ventricle has no regional  wall motion abnormalities. Left ventricular diastolic function could not  be evaluated.  2. Right ventricular systolic function is normal. The right ventricular  size is normal. There is normal pulmonary artery systolic pressure. The  estimated right ventricular systolic pressure is 28.0 mmHg.  3. The mitral valve is abnormal. Trivial mitral valve regurgitation.  4. The aortic valve is tricuspid. Aortic valve regurgitation is not  visualized.  5. The inferior vena cava is normal in size with greater than 50%  respiratory variability, suggesting right atrial pressure of 3 mmHg.   Comparison(s): No prior Echocardiogram.   Patient Profile     87 y.o. female with a hx of hypothyroidism and depression  who is being seen today for the evaluation of atrial flutter at the request of Dr. Katrinka Blazing.  Assessment & Plan    1. Atrial flutter: -Rates worse yesterday after ambulation with her bath>>some improvement today however not optimal with rates in the 110-130 range  -She was continued on metoprolol tartrate 25 BID -Started on IV diltiazem however pt reportedly had subsequent bradycardia and hypotension therefore this was stopped. Will up titrate BB and follow closely  -Plan if persistently elevated rates despite the above interventions>>could consider TEE/DCCV on Wednesday -Continue AC with Eliquis 2.5mg  BID   2. Hypothyroidism: -TSH, 10.459 on 12/20/20 -Levothyroxine increased per IM   Signed, Georgie Chard NP-C [facility] Pager: [phone] 12/23/2020, 9:58 AM     For questions or updates, please contact   Please consult [email or web address] for contact info under Cardiology/STEMI.

## 2020-12-23 NOTE — Evaluation (Signed)
Physical Therapy Evaluation Patient Details Name: Diana Lee MRN: [id number] DOB: February 20, 1929 Today's Date: 12/23/2020   History of Present Illness  Patient is a 87 y/o female who presents with palpitations and near syncope. Found to have new onset A-fib with RVR/A-flutter and hypothyroidism. PMH includes depression and thyroid disease.  Clinical Impression  Patient presents with generalized weakness, decreased activity tolerance and impaired mobility s/p above. Pt lives alone and is independent for ADLs/IADLs PTA. Reports no falls. Today, pt tolerated transfers and gait training with supervision for safety. Used RW for ambulation today for safety. HR ranged from 117-136 bpm with activity, mainly staying in 130s with activity, asymptomatic. Discussed importance of walking a few times per day with nursing and to/from bathroom to improve overall strength/mobility while in the hospital. Pt/son agree. Will follow acutely to maximize independence and mobility prior to return home.    Follow Up Recommendations No PT follow up;Supervision - Intermittent    Equipment Recommendations  None recommended by PT    Recommendations for Other Services       Precautions / Restrictions Precautions Precautions: Other (comment) Precaution Comments: watch HR Restrictions Weight Bearing Restrictions: No      Mobility  Bed Mobility Overal bed mobility: Modified Independent             General bed mobility comments: Able to get to EOB with increased time, no assist needed.    Transfers Overall transfer level: Needs assistance Equipment used: None Transfers: Sit to/from Stand Sit to Stand: Supervision         General transfer comment: Supervision for safety. Stood from [employer], transferred to chair post ambulation.  Ambulation/Gait Ambulation/Gait assistance: Min Emergency planning/management officer (Feet): 250 Feet Assistive device: Rolling walker (2 wheeled) Gait Pattern/deviations:  Step-through pattern;Decreased stride length;Trunk flexed   Gait velocity interpretation: 1.31 - 2.62 ft/sec, indicative of limited community ambulator General Gait Details: Slow, mostly steady gait with RW for support; some mild cues for RW management esp with turns. HR 117-136 bpm but mainly stayed in 130s throughout activity, asymptomatic. Able to walk some in room without RW, holding onto bed rail for support.  Stairs            Wheelchair Mobility    Modified Rankin (Stroke Patients Only)       Balance Overall balance assessment: Mild deficits observed, not formally tested                                           Pertinent Vitals/Pain Pain Assessment: No/denies pain    Home Living Family/patient expects to be discharged to:: Private residence Living Arrangements: Alone Available Help at Discharge: Family;Available PRN/intermittently Type of Home: House Home Access: Stairs to enter Entrance Stairs-Rails: Right Entrance Stairs-Number of Steps: 5 Home Layout: One level Home Equipment: Emergency planning/management officer - 2 wheels;Bedside commode;Grab bars - tub/shower Additional Comments: Above DME was her late husband's which she does not use.    Prior Function Level of Independence: Independent         Comments: Does own ADLs/IADls. No falls reported. Has not been going out as much due to Covid.     Hand Dominance   Dominant Hand: Right    Extremity/Trunk Assessment   Upper Extremity Assessment Upper Extremity Assessment: Defer to OT evaluation    Lower Extremity Assessment Lower Extremity Assessment: Generalized weakness (but functional)    Cervical /  Trunk Assessment Cervical / Trunk Assessment: Kyphotic  Communication   Communication: No difficulties  Cognition Arousal/Alertness: Awake/alert Behavior During Therapy: WFL for tasks assessed/performed Overall Cognitive Status: Within Functional Limits for tasks assessed                                         General Comments General comments (skin integrity, edema, etc.): Son present during session. HR ranging from 117-136 bpm with activity. Mainly stayed in 130s with movement.    Exercises     Assessment/Plan    PT Assessment Patient needs continued PT services  PT Problem List Decreased strength;Decreased mobility;Cardiopulmonary status limiting activity;Decreased activity tolerance;Decreased knowledge of use of DME       PT Treatment Interventions DME instruction;Gait training;Therapeutic exercise;Balance training;Stair training;Functional mobility training;Patient/family education;Therapeutic activities    PT Goals (Current goals can be found in the Care Plan section)  Acute Rehab PT Goals Patient Stated Goal: to get better and go home to take care of my daughter PT Goal Formulation: With patient Time For Goal Achievement: 12/31/20 Potential to Achieve Goals: Good    Frequency Min 3X/week   Barriers to discharge Decreased caregiver support lives alone    Co-evaluation               AM-PAC PT "6 Clicks" Mobility  Outcome Measure Help needed turning from your back to your side while in a flat bed without using bedrails?: None Help needed moving from lying on your back to sitting on the side of a flat bed without using bedrails?: None Help needed moving to and from a bed to a chair (including a wheelchair)?: None Help needed standing up from a chair using your arms (e.g., wheelchair or bedside chair)?: None Help needed to walk in hospital room?: A Little Help needed climbing 3-5 steps with a railing? : A Little 6 Click Score: 22    End of Session Equipment Utilized During Treatment: Gait belt Activity Tolerance: Patient tolerated treatment well;Treatment limited secondary to medical complications (Comment) (tachycardia) Patient left: in chair;with call bell/phone within reach;with family/visitor present Nurse Communication: Mobility  status PT Visit Diagnosis: Difficulty in walking, not elsewhere classified (R26.2);Muscle weakness (generalized) (M62.81)    Time: 0936-1000 PT Time Calculation (min) (ACUTE ONLY): 24 min   Charges:   PT Evaluation $PT Eval Moderate Complexity: 1 Mod PT Treatments $Gait Training: 8-22 mins        Vale Haven, PT, DPT Acute Rehabilitation Services Pager [phone] Office [phone]      Blake Divine A Lanier Ensign 12/23/2020, 12:03 PM

## 2020-12-23 NOTE — Progress Notes (Signed)
PROGRESS NOTE    Diana Lee  ATF:[id number] DOB: July 22, 1929 DOA: 12/20/2020 PCP: [name], MD   Brief Narrative: Patient is a 87 year old female with past medical history of hypothyroidism presented to the hospital with near syncope for 2 days with palpitations.  In the ED patient was noted to be tachycardic at 130 bpm, troponin was mildly elevated at 13.  BNP of 238.  TSH was elevated at 10.4.  Patient was noted to be in atrial fibrillation with RVR and was admitted to hospital for further evaluation and treatment.    Assessment & Plan:   Principal Problem:   Atrial flutter (HCC) Active Problems:   Hypothyroidism  Atrial flutter. Patient centered with near syncope.  Currently asymptomatic.  On Cardizem drip, metoprolol.  Transitioned to Eliquis for anticoagulation.  Potential need for TEE/Cardioversion this admission as per cardiology..   Hypothyroidism: TSH 10.4.  Free T4 free T3 low normal range.  Previous TSH was 10.4 and free T4 was 0.7.  At this time, patient has been increased on Synthroid 50 mcg/day.    DVT prophylaxis:  Eliquis  Code Status:  Full code  Family Communication:  None  Disposition Plan:  Status is: Inpatient  The patient is inpatient due to need for further monitoring, cardiology evaluation and possible cardiac intervention including cardioversion  Dispo: The patient is from: Home              Anticipated d/c is to: Home              Anticipated d/c date is: 2 days              Patient currently is not medically stable to d/c.   Difficult to place patient No  Consultants:   Cardiology   Procedures:   2D ECHO  Antimicrobials:  None  Subjective: Patient denies any dizziness, lightheadedness, chest pain, shortness of breath.  Objective: Vitals:   12/22/20 1552 12/22/20 1750 12/22/20 2009 12/23/20 0626  BP: 112/61 (!) 108/58 109/68 127/73  Pulse: 93  89 91  Resp: 16 16 16 15   Temp: 97.8 F (36.6 C) 97.7 F (36.5 C) 98.4 F  (36.9 C) 98.3 F (36.8 C)  TempSrc:  Oral  Oral  SpO2: 99% 99% 97% 98%  Weight:    60.5 kg  Height:        Intake/Output Summary (Last 24 hours) at 12/23/2020 0937 Last data filed at 12/22/2020 1855 Gross per 24 hour  Intake 495.6 ml  Output --  Net 495.6 ml   Filed Weights   12/21/20 0447 12/22/20 0458 12/23/20 0626  Weight: 60.3 kg 61.2 kg 60.5 kg   Body mass index is 22.88 kg/m.  Physical examination: General: Thinly built, not in obvious distress HENT:   No scleral pallor or icterus noted. Oral mucosa is moist.  Chest:  Clear breath sounds.  Diminished breath sounds bilaterally. No crackles or wheezes.  CVS: S1 &S2 heard.  Tachycardia, Abdomen: Soft, nontender, nondistended.  Bowel sounds are heard.   Extremities: No cyanosis, clubbing or edema.  Peripheral pulses are palpable. Psych: Alert, awake and oriented, normal mood CNS:  No cranial nerve deficits.  Power equal in all extremities.   Skin: Warm and dry.  No rashes noted.   Data Reviewed: I have personally reviewed following labs and imaging studies  CBC: Recent Labs  Lab 12/20/20 1214 12/21/20 0328 12/22/20 0533 12/23/20 0235  WBC 9.4 6.1 6.7 6.8  NEUTROABS 7.4  --   --   --  HGB 14.2 13.9 14.3 13.2  HCT 45.2 40.9 44.1 39.2  MCV 103.4* 99.8 99.8 99.7  PLT 199 186 204 182   Basic Metabolic Panel: Recent Labs  Lab 12/20/20 1214 12/20/20 1616 12/21/20 0328 12/23/20 0235  NA 139  --  139 139  K 4.3  --  3.9 4.2  CL 103  --  104 104  CO2 24  --  24 22  GLUCOSE 105*  --  97 100*  BUN 20  --  16 20  CREATININE 0.92  --  0.82 0.89  CALCIUM 10.0  --  9.5 9.5  MG  --  2.2  --  2.0   GFR: Estimated Creatinine Clearance: 37 mL/min (by C-G formula based on SCr of 0.89 mg/dL). Liver Function Tests: Recent Labs  Lab 12/20/20 1214  AST 39  ALT 25  ALKPHOS 63  BILITOT 0.8  PROT 6.9  ALBUMIN 4.0   No results for input(s): LIPASE, AMYLASE in the last 168 hours. No results for input(s): AMMONIA  in the last 168 hours. Coagulation Profile: Recent Labs  Lab 12/20/20 1214  INR 1.0   Cardiac Enzymes: No results for input(s): CKTOTAL, CKMB, CKMBINDEX, TROPONINI in the last 168 hours. BNP (last 3 results) No results for input(s): PROBNP in the last 8760 hours. HbA1C: No results for input(s): HGBA1C in the last 72 hours. CBG: No results for input(s): GLUCAP in the last 168 hours. Lipid Profile: No results for input(s): CHOL, HDL, LDLCALC, TRIG, CHOLHDL, LDLDIRECT in the last 72 hours. Thyroid Function Tests: Recent Labs    12/20/20 1214 12/20/20 1350  TSH 10.459*  --   T4TOTAL  --  6.6  T3FREE  --  2.8   Anemia Panel: No results for input(s): VITAMINB12, FOLATE, FERRITIN, TIBC, IRON, RETICCTPCT in the last 72 hours. Sepsis Labs: No results for input(s): PROCALCITON, LATICACIDVEN in the last 168 hours.  Recent Results (from the past 240 hour(s))  Resp Panel by RT-PCR (Flu A&B, Covid) Nasopharyngeal Swab     Status: None   Collection Time: 12/20/20 12:14 PM   Specimen: Nasopharyngeal Swab; Nasopharyngeal(NP) swabs in vial transport medium  Result Value Ref Range Status   SARS Coronavirus 2 by RT PCR NEGATIVE NEGATIVE Final    Comment: (NOTE) SARS-CoV-2 target nucleic acids are NOT DETECTED.  The SARS-CoV-2 RNA is generally detectable in upper respiratory specimens during the acute phase of infection. The lowest concentration of SARS-CoV-2 viral copies this assay can detect is 138 copies/mL. A negative result does not preclude SARS-Cov-2 infection and should not be used as the sole basis for treatment or other patient management decisions. A negative result may occur with  improper specimen collection/handling, submission of specimen other than nasopharyngeal swab, presence of viral mutation(s) within the areas targeted by this assay, and inadequate number of viral copies(<138 copies/mL). A negative result must be combined with clinical observations, patient history,  and epidemiological information. The expected result is Negative.  Fact Sheet for Patients:  BloggerCourse.com  Fact Sheet for Healthcare Providers:  SeriousBroker.it  This test is no t yet approved or cleared by the [place] FDA and  has been authorized for detection and/or diagnosis of SARS-CoV-2 by FDA under an Emergency Use Authorization (EUA). This EUA will remain  in effect (meaning this test can be used) for the duration of the COVID-19 declaration under Section 564(b)(1) of the Act, 21 U.S.C.section 360bbb-3(b)(1), unless the authorization is terminated  or revoked sooner.       Influenza  A by PCR NEGATIVE NEGATIVE Final   Influenza B by PCR NEGATIVE NEGATIVE Final    Comment: (NOTE) The Xpert Xpress SARS-CoV-2/FLU/RSV plus assay is intended as an aid in the diagnosis of influenza from Nasopharyngeal swab specimens and should not be used as a sole basis for treatment. Nasal washings and aspirates are unacceptable for Xpert Xpress SARS-CoV-2/FLU/RSV testing.  Fact Sheet for Patients: BloggerCourse.comhttps://www.fda.gov/media/152166/download  Fact Sheet for Healthcare Providers: SeriousBroker.ithttps://www.fda.gov/media/152162/download  This test is not yet approved or cleared by the [place] FDA and has been authorized for detection and/or diagnosis of SARS-CoV-2 by FDA under an Emergency Use Authorization (EUA). This EUA will remain in effect (meaning this test can be used) for the duration of the COVID-19 declaration under Section 564(b)(1) of the Act, 21 U.S.C. section 360bbb-3(b)(1), unless the authorization is terminated or revoked.  Performed at [facility], [address] [place], [place] [address]          Radiology Studies: ECHOCARDIOGRAM COMPLETE  Result Date: 12/21/2020    ECHOCARDIOGRAM REPORT   Patient Name:   Alvina ChouBETTY Boman Date of Exam: 12/21/2020 Medical Rec #:  [id number]   Height:       64.0 in Accession #:     [id number]  Weight:       132.9 lb Date of Birth:  [birth year]   BSA:          1.644 m Patient Age:    89 years    BP:           135/95 mmHg Patient Gender: F           HR:           101 bpm. Exam Location:  Inpatient Procedure: 2D Echo, Cardiac Doppler and Color Doppler Indications:    Atrial fibrillation and Flutter  History:        Patient has no prior history of Echocardiogram examinations.                 First time in hospital since 1996 for gall bladder surgery.                 Healthy 87 year-old.  Sonographer:    Roosvelt Maserachel Lane RDCS Referring Phys: [id number] [facility]  Sonographer Comments: Suboptimal apical window. IMPRESSIONS  1. Left ventricular ejection fraction, by estimation, is 60 to 65%. The left ventricle has normal function. The left ventricle has no regional wall motion abnormalities. Left ventricular diastolic function could not be evaluated.  2. Right ventricular systolic function is normal. The right ventricular size is normal. There is normal pulmonary artery systolic pressure. The estimated right ventricular systolic pressure is 28.0 mmHg.  3. The mitral valve is abnormal. Trivial mitral valve regurgitation.  4. The aortic valve is tricuspid. Aortic valve regurgitation is not visualized.  5. The inferior vena cava is normal in size with greater than 50% respiratory variability, suggesting right atrial pressure of 3 mmHg. Comparison(s): No prior Echocardiogram. FINDINGS  Left Ventricle: Left ventricular ejection fraction, by estimation, is 60 to 65%. The left ventricle has normal function. The left ventricle has no regional wall motion abnormalities. The left ventricular internal cavity size was normal in size. There is  no left ventricular hypertrophy. Left ventricular diastolic function could not be evaluated due to atrial fibrillation. Left ventricular diastolic function could not be evaluated. Right Ventricle: The right ventricular size is normal. No increase in right ventricular wall  thickness. Right ventricular systolic function is normal. There  is normal pulmonary artery systolic pressure. The tricuspid regurgitant velocity is 2.50 m/s, and  with an assumed right atrial pressure of 3 mmHg, the estimated right ventricular systolic pressure is 28.0 mmHg. Left Atrium: Left atrial size was normal in size. Right Atrium: Right atrial size was normal in size. Pericardium: There is no evidence of pericardial effusion. Mitral Valve: The mitral valve is abnormal. There is mild thickening of the mitral valve leaflet(s). Trivial mitral valve regurgitation. Tricuspid Valve: The tricuspid valve is grossly normal. Tricuspid valve regurgitation is mild. Aortic Valve: The aortic valve is tricuspid. Aortic valve regurgitation is not visualized. Pulmonic Valve: The pulmonic valve was not well visualized. Pulmonic valve regurgitation is not visualized. Aorta: The aortic root and ascending aorta are structurally normal, with no evidence of dilitation. Venous: The inferior vena cava is normal in size with greater than 50% respiratory variability, suggesting right atrial pressure of 3 mmHg. IAS/Shunts: No atrial level shunt detected by color flow Doppler.  LEFT VENTRICLE PLAX 2D LVIDd:         3.70 cm LVIDs:         2.90 cm LV PW:         0.90 cm LV IVS:        0.80 cm LVOT diam:     1.90 cm LVOT Area:     2.84 cm  LV Volumes (MOD) LV vol d, MOD A4C: 24.1 ml LV vol s, MOD A4C: 8.5 ml LV SV MOD A4C:     24.1 ml RIGHT VENTRICLE          IVC RV Basal diam:  3.90 cm  IVC diam: 2.00 cm LEFT ATRIUM           Index       RIGHT ATRIUM           Index LA diam:      2.70 cm 1.64 cm/m  RA Area:     18.60 cm LA Vol (A4C): 39.1 ml 23.78 ml/m RA Volume:   51.30 ml  31.20 ml/m   AORTA Ao Root diam: 2.40 cm Ao Asc diam:  2.40 cm TRICUSPID VALVE TR Peak grad:   25.0 mmHg TR Vmax:        250.00 cm/s  SHUNTS Systemic Diam: 1.90 cm Zoila Shutter MD Electronically signed by Zoila Shutter MD Signature Date/Time: 12/21/2020/12:59:33  PM    Final      Scheduled Meds: . apixaban  2.5 mg Oral BID  . cholecalciferol  1,000 Units Oral Daily  . levothyroxine  50 mcg Oral Q0600  . metoprolol tartrate  25 mg Oral BID  . sertraline  50 mg Oral Daily   Continuous Infusions: . diltiazem (CARDIZEM) infusion Stopped (12/22/20 1140)     LOS: 1 day    Joycelyn Das, MD [facility]   If 7PM-7AM, please contact night-coverage [email or web address]  12/23/2020, 9:37 AM

## 2020-12-24 ENCOUNTER — Other Ambulatory Visit: Payer: Self-pay | Admitting: Internal Medicine

## 2020-12-24 DIAGNOSIS — I483 Typical atrial flutter: Secondary | ICD-10-CM | POA: Diagnosis not present

## 2020-12-24 DIAGNOSIS — R55 Syncope and collapse: Secondary | ICD-10-CM | POA: Diagnosis not present

## 2020-12-24 DIAGNOSIS — E039 Hypothyroidism, unspecified: Secondary | ICD-10-CM | POA: Diagnosis not present

## 2020-12-24 LAB — BASIC METABOLIC PANEL
Anion gap: 7 (ref 5–15)
BUN: 25 mg/dL — ABNORMAL HIGH (ref 8–23)
CO2: 29 mmol/L (ref 22–32)
Calcium: 9.8 mg/dL (ref 8.9–10.3)
Chloride: 103 mmol/L (ref 98–111)
Creatinine, Ser: 0.98 mg/dL (ref 0.44–1.00)
GFR, Estimated: 55 mL/min — ABNORMAL LOW (ref >60)
Glucose, Bld: 99 mg/dL (ref 70–99)
Potassium: 5.1 mmol/L (ref 3.5–5.1)
Sodium: 139 mmol/L (ref 135–145)

## 2020-12-24 LAB — CBC
HCT: 42.7 % (ref 36.0–46.0)
Hemoglobin: 13.6 g/dL (ref 12.0–15.0)
MCH: 32.3 pg (ref 26.0–34.0)
MCHC: 31.9 g/dL (ref 30.0–36.0)
MCV: 101.4 fL — ABNORMAL HIGH (ref 80.0–100.0)
Platelets: 203 10*3/uL (ref 150–400)
RBC: 4.21 MIL/uL (ref 3.87–5.11)
RDW: 12.1 % (ref 11.5–15.5)
WBC: 7.6 10*3/uL (ref 4.0–10.5)
nRBC: 0 % (ref 0.0–0.2)

## 2020-12-24 LAB — MAGNESIUM: Magnesium: 2.1 mg/dL (ref 1.7–2.4)

## 2020-12-24 LAB — PHOSPHORUS: Phosphorus: 3.9 mg/dL (ref 2.5–4.6)

## 2020-12-24 MED ORDER — LEVOTHYROXINE SODIUM 25 MCG PO TABS: 50.0000 ug | ORAL_TABLET | Freq: Every day | ORAL | 2 refills | Status: DC

## 2020-12-24 MED ORDER — METOPROLOL TARTRATE 50 MG PO TABS: 50.0000 mg | ORAL_TABLET | Freq: Two times a day (BID) | ORAL | 2 refills | Status: DC

## 2020-12-24 MED ORDER — APIXABAN 2.5 MG PO TABS: 2.5000 mg | ORAL_TABLET | Freq: Two times a day (BID) | ORAL | 2 refills | Status: DC

## 2020-12-24 MED ORDER — METOPROLOL TARTRATE 50 MG PO TABS
50.0000 mg | ORAL_TABLET | Freq: Two times a day (BID) | ORAL | Status: DC
  Administered 2020-12-24: 50 mg via ORAL
  Filled 2020-12-24: qty 1

## 2020-12-24 MED FILL — ELIQUIS 2.5 MG TABLET: 2.5 | 30 days supply | Qty: 60 | Fill #0

## 2020-12-24 MED FILL — METOPROLOL TARTRATE 50 MG T: 50 | 30 days supply | Qty: 60 | Fill #0

## 2020-12-24 MED FILL — LEVOTHYROXINE SODIUM 50 MCG: 50 | 30 days supply | Qty: 30 | Fill #0

## 2020-12-24 NOTE — Care Management (Signed)
12-24-20 1128 Benefits check submitted for Eliquis. Case Manager will follow for cost. Graves-Bigelow, Lamar Laundry, RN,BSN Case Manager

## 2020-12-24 NOTE — Plan of Care (Signed)

## 2020-12-24 NOTE — TOC Benefit Eligibility Note (Signed)
Transition of Care [facility]) Benefit Eligibility Note    Patient Details  Name: Diana Lee MRN: [id number] Date of Birth: Jul 22, 1929   Medication/Dose: ELIQUIS  5 MG BID CO-PAY- $47.00    and    ELIQUIS  2.5 MG BID CO-PAY-$19.37  Covered?: Yes  Tier: 3 Drug  Prescription Coverage Preferred Pharmacy: [facility]  Spoke with Person/Company/Phone Number:: ARIANA  @ [employer] #  [phone]  Co-Pay: $47.00  Prior Approval: No  Deductible: Unmet (OUT-OF-POCKET:UNMET)  Additional Notes: APIXABAN : Earle Gell Phone Number: 12/24/2020, 1:19 PM

## 2020-12-24 NOTE — Progress Notes (Addendum)
PROGRESS NOTE    Diana Lee  PXT:[id number] DOB: 11-30-1928 DOA: 12/20/2020 PCP: [name], MD   Brief Narrative:  Patient is a 87 year old female with past medical history of hypothyroidism presented to the hospital with near syncope for 2 days with palpitations.  In the ED, patient was noted to be tachycardic at 130 bpm, troponin was mildly elevated at 13.  BNP of 238.  TSH was elevated at 10.4.  Patient was noted to be in atrial fibrillation with RVR and was admitted to hospital for further evaluation and treatment.  Cardiology followed the patient during hospitalization and has been titrating medications to control her heart rate medically.  Assessment & Plan:   Principal Problem:   Atrial flutter (HCC) Active Problems:   Hypothyroidism  Atrial flutter. Patient presented with near syncope.  Currently asymptomatic. off Cardizem for hypotension, has been titrated on metoprolol.  Continue Eliquis for anticoagulation.  Cardiology closely following for possible need of cardioversion but at this time patient might be tolerating beta-blockers.  Hypothyroidism:  on Synthroid 50 mcg/day.  Will need TSH follow-up in 4 to 6 weeks as outpatient.   DVT prophylaxis:  Eliquis  Code Status:  Full code  Family Communication:  Spoke with the patient's son at bedside  Disposition Plan:  Status is: Inpatient  The patient is inpatient due to need for further monitoring, cardiology evaluation and possible cardiac intervention including cardioversion  Dispo: The patient is from: Home              Anticipated d/c is to: Home              Anticipated d/c date is: 1-2 days, when okay with cardiology.              Patient currently is not medically stable to d/c.   Difficult to place patient No  Consultants:   Cardiology   Procedures:   2D ECHO  Antimicrobials:  None  Subjective: Today, patient was seen and examined at bedside.  Denies any dizziness, lightheadedness, shortness  of breath, chest pain  Objective: Vitals:   12/23/20 1945 12/24/20 0600 12/24/20 0805 12/24/20 0939  BP:  126/63 123/61 123/61  Pulse:  88 90 88  Resp: 17 18 18    Temp:  97.8 F (36.6 C) 97.7 F (36.5 C)   TempSrc:  Oral Oral   SpO2: 97% 96% 100%   Weight:  60.4 kg    Height:        Intake/Output Summary (Last 24 hours) at 12/24/2020 1123 Last data filed at 12/24/2020 0200 Gross per 24 hour  Intake 360 ml  Output --  Net 360 ml   Filed Weights   12/22/20 0458 12/23/20 0626 12/24/20 0600  Weight: 61.2 kg 60.5 kg 60.4 kg   Body mass index is 22.86 kg/m.  Physical examination: General:  Thinly built, not in obvious distress HENT:   No scleral pallor or icterus noted. Oral mucosa is moist.  Chest:  Clear breath sounds.  Diminished breath sounds bilaterally. No crackles or wheezes.  CVS: S1 &S2 heard. No murmur, mild tachycardia. Abdomen: Soft, nontender, nondistended.  Bowel sounds are heard.   Extremities: No cyanosis, clubbing or edema.  Peripheral pulses are palpable. Psych: Alert, awake and oriented, normal mood CNS:  No cranial nerve deficits.  Power equal in all extremities.   Skin: Warm and dry.  No rashes noted.   Data Reviewed: I have personally reviewed following labs and imaging studies  CBC: Recent Labs  Lab 12/20/20 1214 12/21/20 0328 12/22/20 0533 12/23/20 0235 12/24/20 0208  WBC 9.4 6.1 6.7 6.8 7.6  NEUTROABS 7.4  --   --   --   --   HGB 14.2 13.9 14.3 13.2 13.6  HCT 45.2 40.9 44.1 39.2 42.7  MCV 103.4* 99.8 99.8 99.7 101.4*  PLT 199 186 204 182 203   Basic Metabolic Panel: Recent Labs  Lab 12/20/20 1214 12/20/20 1616 12/21/20 0328 12/23/20 0235 12/24/20 0208  NA 139  --  139 139 139  K 4.3  --  3.9 4.2 5.1  CL 103  --  104 104 103  CO2 24  --  24 22 29   GLUCOSE 105*  --  97 100* 99  BUN 20  --  16 20 25*  CREATININE 0.92  --  0.82 0.89 0.98  CALCIUM 10.0  --  9.5 9.5 9.8  MG  --  2.2  --  2.0 2.1  PHOS  --   --   --   --  3.9    GFR: Estimated Creatinine Clearance: 33.6 mL/min (by C-G formula based on SCr of 0.98 mg/dL). Liver Function Tests: Recent Labs  Lab 12/20/20 1214  AST 39  ALT 25  ALKPHOS 63  BILITOT 0.8  PROT 6.9  ALBUMIN 4.0   No results for input(s): LIPASE, AMYLASE in the last 168 hours. No results for input(s): AMMONIA in the last 168 hours. Coagulation Profile: Recent Labs  Lab 12/20/20 1214  INR 1.0   Cardiac Enzymes: No results for input(s): CKTOTAL, CKMB, CKMBINDEX, TROPONINI in the last 168 hours. BNP (last 3 results) No results for input(s): PROBNP in the last 8760 hours. HbA1C: No results for input(s): HGBA1C in the last 72 hours. CBG: No results for input(s): GLUCAP in the last 168 hours. Lipid Profile: No results for input(s): CHOL, HDL, LDLCALC, TRIG, CHOLHDL, LDLDIRECT in the last 72 hours. Thyroid Function Tests: No results for input(s): TSH, T4TOTAL, FREET4, T3FREE, THYROIDAB in the last 72 hours. Anemia Panel: No results for input(s): VITAMINB12, FOLATE, FERRITIN, TIBC, IRON, RETICCTPCT in the last 72 hours. Sepsis Labs: No results for input(s): PROCALCITON, LATICACIDVEN in the last 168 hours.  Recent Results (from the past 240 hour(s))  Resp Panel by RT-PCR (Flu A&B, Covid) Nasopharyngeal Swab     Status: None   Collection Time: 12/20/20 12:14 PM   Specimen: Nasopharyngeal Swab; Nasopharyngeal(NP) swabs in vial transport medium  Result Value Ref Range Status   SARS Coronavirus 2 by RT PCR NEGATIVE NEGATIVE Final    Comment: (NOTE) SARS-CoV-2 target nucleic acids are NOT DETECTED.  The SARS-CoV-2 RNA is generally detectable in upper respiratory specimens during the acute phase of infection. The lowest concentration of SARS-CoV-2 viral copies this assay can detect is 138 copies/mL. A negative result does not preclude SARS-Cov-2 infection and should not be used as the sole basis for treatment or other patient management decisions. A negative result may occur  with  improper specimen collection/handling, submission of specimen other than nasopharyngeal swab, presence of viral mutation(s) within the areas targeted by this assay, and inadequate number of viral copies(<138 copies/mL). A negative result must be combined with clinical observations, patient history, and epidemiological information. The expected result is Negative.  Fact Sheet for Patients:  12/22/20  Fact Sheet for Healthcare Providers:  BloggerCourse.com  This test is no t yet approved or cleared by the [place] FDA and  has been authorized for detection and/or diagnosis of SARS-CoV-2 by FDA under an  Emergency Use Authorization (EUA). This EUA will remain  in effect (meaning this test can be used) for the duration of the COVID-19 declaration under Section 564(b)(1) of the Act, 21 U.S.C.section 360bbb-3(b)(1), unless the authorization is terminated  or revoked sooner.       Influenza A by PCR NEGATIVE NEGATIVE Final   Influenza B by PCR NEGATIVE NEGATIVE Final    Comment: (NOTE) The Xpert Xpress SARS-CoV-2/FLU/RSV plus assay is intended as an aid in the diagnosis of influenza from Nasopharyngeal swab specimens and should not be used as a sole basis for treatment. Nasal washings and aspirates are unacceptable for Xpert Xpress SARS-CoV-2/FLU/RSV testing.  Fact Sheet for Patients: BloggerCourse.com  Fact Sheet for Healthcare Providers: SeriousBroker.it  This test is not yet approved or cleared by the [place] FDA and has been authorized for detection and/or diagnosis of SARS-CoV-2 by FDA under an Emergency Use Authorization (EUA). This EUA will remain in effect (meaning this test can be used) for the duration of the COVID-19 declaration under Section 564(b)(1) of the Act, 21 U.S.C. section 360bbb-3(b)(1), unless the authorization is terminated  or revoked.  Performed at [facility], [address] [place], [place] [address]       Radiology Studies: No results found.   Scheduled Meds: . apixaban  2.5 mg Oral BID  . cholecalciferol  1,000 Units Oral Daily  . levothyroxine  50 mcg Oral Q0600  . metoprolol tartrate  50 mg Oral BID  . sertraline  50 mg Oral Daily   Continuous Infusions:    LOS: 2 days    Joycelyn Das, MD [facility]   If 7PM-7AM, please contact night-coverage [email or web address]  12/24/2020, 11:23 AM

## 2020-12-24 NOTE — Discharge Summary (Signed)
Physician Discharge Summary  Diana Lee OEH:[id number] DOB: 07/24/1929 DOA: 12/20/2020  PCP: [name], MD  Admit date: 12/20/2020 Discharge date: 12/24/2020  Admitted From: Home  Discharge disposition: home  Recommendations for Outpatient Follow-Up:   . Follow up with your primary care provider in one week.  . Check CBC, BMP, magnesium in the next visit . Dose of Synthroid was increased from 25 to 50 mcg.  Will need to check TSH in 4 to 6 weeks. . Patient will need to follow-up with cardiology on 12/31/2020.  Patient might need DC cardioversion as outpatient.  Please continue Eliquis for anticoagulation.  Discharge Diagnosis:   Principal Problem:   Atrial flutter (HCC) Active Problems:   Hypothyroidism   Near syncope   Discharge Condition: Improved.  Diet recommendation: Low sodium, heart healthy.    Wound care: None.  Code status: Full.   History of Present Illness:  Patient is a 87 year old female with past medical history of hypothyroidism presented to the hospital with near syncope for 2 days with palpitations.  In the ED, patient was noted to be tachycardic at 130 bpm, troponin was mildly elevated at 13.  BNP of 238.  TSH was elevated at 10.4.  Patient was noted to be in atrial fibrillation with RVR and was admitted to hospital for further evaluation and treatment.  Cardiology followed the patient during hospitalization and patient was adjusted on medications.  Patient was initiated on metoprolol.  Hospital Course:   Following conditions were addressed during hospitalization as listed below,  Atrial flutter. Patient presented with near syncope.  Currently asymptomatic.  Was initiated on metoprolol and the dose has been titrated to 50 twice daily.  She could not tolerate Cardizem due to hypotension.  At this time, heart rate has improved.  Cardiology recommended outpatient follow-up and DC cardioversion as outpatient at this time.  Patient will be continued on  Eliquis for anticoagulation.    Hypothyroidism:  on Synthroid 50 mcg/day.    Dose of Synthroid was increased from 25-50 at this time.  Will need TSH follow-up in 4 to 6 weeks as outpatient.  Disposition.  At this time, patient is stable for disposition home.  Spoke with cardiology prior to disposition.  Spoke with the patient regarding precautions on blood thinners  Medical Consultants:    Cardiology  Procedures:    2D echo Subjective:   Today, patient seen and examined at bedside.  Denies any chest pain, dizziness, drowsiness, shortness of breath  Discharge Exam:   Vitals:   12/24/20 0939 12/24/20 1422  BP: 123/61 108/68  Pulse: 88 83  Resp:    Temp:  99 F (37.2 C)  SpO2:  100%   Vitals:   12/24/20 0600 12/24/20 0805 12/24/20 0939 12/24/20 1422  BP: 126/63 123/61 123/61 108/68  Pulse: 88 90 88 83  Resp: 18 18    Temp: 97.8 F (36.6 C) 97.7 F (36.5 C)  99 F (37.2 C)  TempSrc: Oral Oral  Oral  SpO2: 96% 100%  100%  Weight: 60.4 kg     Height:       General:  Thinly built, not in obvious distress HENT:   No scleral pallor or icterus noted. Oral mucosa is moist.  Chest:  Clear breath sounds.  Diminished breath sounds bilaterally. No crackles or wheezes.  CVS: S1 &S2 heard. No murmur, mild tachycardia. Abdomen: Soft, nontender, nondistended.  Bowel sounds are heard.   Extremities: No cyanosis, clubbing or edema.  Peripheral pulses are palpable.  Psych: Alert, awake and oriented, normal mood CNS:  No cranial nerve deficits.  Power equal in all extremities.   Skin: Warm and dry.  No rashes noted.  The results of significant diagnostics from this hospitalization (including imaging, microbiology, ancillary and laboratory) are listed below for reference.     Diagnostic Studies:   DG Chest Portable 1 View  Result Date: 12/20/2020 CLINICAL DATA:  Palpitations. EXAM: PORTABLE CHEST 1 VIEW COMPARISON:  12/24/2004 FINDINGS: 1228 hours. Lungs are hyperexpanded.  Interstitial markings are diffusely coarsened with chronic features. The cardiopericardial silhouette is within normal limits for size. Telemetry leads overlie the chest. IMPRESSION: Hyperexpansion with chronic interstitial coarsening. No acute cardiopulmonary findings. Electronically Signed   By: Kennith Center M.D.   On: 12/20/2020 12:40   ECHOCARDIOGRAM COMPLETE  Result Date: 12/21/2020    ECHOCARDIOGRAM REPORT   Patient Name:   Diana Lee Date of Exam: 12/21/2020 Medical Rec #:  [id number]   Height:       64.0 in Accession #:    [id number]  Weight:       132.9 lb Date of Birth:  06/20/1929   BSA:          1.644 m Patient Age:    89 years    BP:           135/95 mmHg Patient Gender: F           HR:           101 bpm. Exam Location:  Inpatient Procedure: 2D Echo, Cardiac Doppler and Color Doppler Indications:    Atrial fibrillation and Flutter  History:        Patient has no prior history of Echocardiogram examinations.                 First time in hospital since 1996 for gall bladder surgery.                 Healthy 87 year-old.  Sonographer:    Roosvelt Maser RDCS Referring Phys: [id number] [facility]  Sonographer Comments: Suboptimal apical window. IMPRESSIONS  1. Left ventricular ejection fraction, by estimation, is 60 to 65%. The left ventricle has normal function. The left ventricle has no regional wall motion abnormalities. Left ventricular diastolic function could not be evaluated.  2. Right ventricular systolic function is normal. The right ventricular size is normal. There is normal pulmonary artery systolic pressure. The estimated right ventricular systolic pressure is 28.0 mmHg.  3. The mitral valve is abnormal. Trivial mitral valve regurgitation.  4. The aortic valve is tricuspid. Aortic valve regurgitation is not visualized.  5. The inferior vena cava is normal in size with greater than 50% respiratory variability, suggesting right atrial pressure of 3 mmHg. Comparison(s): No prior Echocardiogram.  FINDINGS  Left Ventricle: Left ventricular ejection fraction, by estimation, is 60 to 65%. The left ventricle has normal function. The left ventricle has no regional wall motion abnormalities. The left ventricular internal cavity size was normal in size. There is  no left ventricular hypertrophy. Left ventricular diastolic function could not be evaluated due to atrial fibrillation. Left ventricular diastolic function could not be evaluated. Right Ventricle: The right ventricular size is normal. No increase in right ventricular wall thickness. Right ventricular systolic function is normal. There is normal pulmonary artery systolic pressure. The tricuspid regurgitant velocity is 2.50 m/s, and  with an assumed right atrial pressure of 3 mmHg, the estimated right ventricular systolic pressure is 28.0 mmHg. Left Atrium: Left  atrial size was normal in size. Right Atrium: Right atrial size was normal in size. Pericardium: There is no evidence of pericardial effusion. Mitral Valve: The mitral valve is abnormal. There is mild thickening of the mitral valve leaflet(s). Trivial mitral valve regurgitation. Tricuspid Valve: The tricuspid valve is grossly normal. Tricuspid valve regurgitation is mild. Aortic Valve: The aortic valve is tricuspid. Aortic valve regurgitation is not visualized. Pulmonic Valve: The pulmonic valve was not well visualized. Pulmonic valve regurgitation is not visualized. Aorta: The aortic root and ascending aorta are structurally normal, with no evidence of dilitation. Venous: The inferior vena cava is normal in size with greater than 50% respiratory variability, suggesting right atrial pressure of 3 mmHg. IAS/Shunts: No atrial level shunt detected by color flow Doppler.  LEFT VENTRICLE PLAX 2D LVIDd:         3.70 cm LVIDs:         2.90 cm LV PW:         0.90 cm LV IVS:        0.80 cm LVOT diam:     1.90 cm LVOT Area:     2.84 cm  LV Volumes (MOD) LV vol d, MOD A4C: 24.1 ml LV vol s, MOD A4C: 8.5 ml LV  SV MOD A4C:     24.1 ml RIGHT VENTRICLE          IVC RV Basal diam:  3.90 cm  IVC diam: 2.00 cm LEFT ATRIUM           Index       RIGHT ATRIUM           Index LA diam:      2.70 cm 1.64 cm/m  RA Area:     18.60 cm LA Vol (A4C): 39.1 ml 23.78 ml/m RA Volume:   51.30 ml  31.20 ml/m   AORTA Ao Root diam: 2.40 cm Ao Asc diam:  2.40 cm TRICUSPID VALVE TR Peak grad:   25.0 mmHg TR Vmax:        250.00 cm/s  SHUNTS Systemic Diam: 1.90 cm Zoila Shutter MD Electronically signed by Zoila Shutter MD Signature Date/Time: 12/21/2020/12:59:33 PM    Final      Labs:   Basic Metabolic Panel: Recent Labs  Lab 12/20/20 1214 12/20/20 1616 12/21/20 0328 12/23/20 0235 12/24/20 0208  NA 139  --  139 139 139  K 4.3  --  3.9 4.2 5.1  CL 103  --  104 104 103  CO2 24  --  24 22 29   GLUCOSE 105*  --  97 100* 99  BUN 20  --  16 20 25*  CREATININE 0.92  --  0.82 0.89 0.98  CALCIUM 10.0  --  9.5 9.5 9.8  MG  --  2.2  --  2.0 2.1  PHOS  --   --   --   --  3.9   GFR Estimated Creatinine Clearance: 33.6 mL/min (by C-G formula based on SCr of 0.98 mg/dL). Liver Function Tests: Recent Labs  Lab 12/20/20 1214  AST 39  ALT 25  ALKPHOS 63  BILITOT 0.8  PROT 6.9  ALBUMIN 4.0   No results for input(s): LIPASE, AMYLASE in the last 168 hours. No results for input(s): AMMONIA in the last 168 hours. Coagulation profile Recent Labs  Lab 12/20/20 1214  INR 1.0    CBC: Recent Labs  Lab 12/20/20 1214 12/21/20 0328 12/22/20 0533 12/23/20 0235 12/24/20 0208  WBC 9.4 6.1 6.7 6.8 7.6  NEUTROABS 7.4  --   --   --   --   HGB 14.2 13.9 14.3 13.2 13.6  HCT 45.2 40.9 44.1 39.2 42.7  MCV 103.4* 99.8 99.8 99.7 101.4*  PLT 199 186 204 182 203   Cardiac Enzymes: No results for input(s): CKTOTAL, CKMB, CKMBINDEX, TROPONINI in the last 168 hours. BNP: Invalid input(s): POCBNP CBG: No results for input(s): GLUCAP in the last 168 hours. D-Dimer No results for input(s): DDIMER in the last 72 hours. Hgb A1c No  results for input(s): HGBA1C in the last 72 hours. Lipid Profile No results for input(s): CHOL, HDL, LDLCALC, TRIG, CHOLHDL, LDLDIRECT in the last 72 hours. Thyroid function studies No results for input(s): TSH, T4TOTAL, T3FREE, THYROIDAB in the last 72 hours.  Invalid input(s): FREET3 Anemia work up No results for input(s): VITAMINB12, FOLATE, FERRITIN, TIBC, IRON, RETICCTPCT in the last 72 hours. Microbiology Recent Results (from the past 240 hour(s))  Resp Panel by RT-PCR (Flu A&B, Covid) Nasopharyngeal Swab     Status: None   Collection Time: 12/20/20 12:14 PM   Specimen: Nasopharyngeal Swab; Nasopharyngeal(NP) swabs in vial transport medium  Result Value Ref Range Status   SARS Coronavirus 2 by RT PCR NEGATIVE NEGATIVE Final    Comment: (NOTE) SARS-CoV-2 target nucleic acids are NOT DETECTED.  The SARS-CoV-2 RNA is generally detectable in upper respiratory specimens during the acute phase of infection. The lowest concentration of SARS-CoV-2 viral copies this assay can detect is 138 copies/mL. A negative result does not preclude SARS-Cov-2 infection and should not be used as the sole basis for treatment or other patient management decisions. A negative result may occur with  improper specimen collection/handling, submission of specimen other than nasopharyngeal swab, presence of viral mutation(s) within the areas targeted by this assay, and inadequate number of viral copies(<138 copies/mL). A negative result must be combined with clinical observations, patient history, and epidemiological information. The expected result is Negative.  Fact Sheet for Patients:  BloggerCourse.comhttps://www.fda.gov/media/152166/download  Fact Sheet for Healthcare Providers:  SeriousBroker.ithttps://www.fda.gov/media/152162/download  This test is no t yet approved or cleared by the [place] FDA and  has been authorized for detection and/or diagnosis of SARS-CoV-2 by FDA under an Emergency Use Authorization (EUA). This  EUA will remain  in effect (meaning this test can be used) for the duration of the COVID-19 declaration under Section 564(b)(1) of the Act, 21 U.S.C.section 360bbb-3(b)(1), unless the authorization is terminated  or revoked sooner.       Influenza A by PCR NEGATIVE NEGATIVE Final   Influenza B by PCR NEGATIVE NEGATIVE Final    Comment: (NOTE) The Xpert Xpress SARS-CoV-2/FLU/RSV plus assay is intended as an aid in the diagnosis of influenza from Nasopharyngeal swab specimens and should not be used as a sole basis for treatment. Nasal washings and aspirates are unacceptable for Xpert Xpress SARS-CoV-2/FLU/RSV testing.  Fact Sheet for Patients: BloggerCourse.comhttps://www.fda.gov/media/152166/download  Fact Sheet for Healthcare Providers: SeriousBroker.ithttps://www.fda.gov/media/152162/download  This test is not yet approved or cleared by the [place] FDA and has been authorized for detection and/or diagnosis of SARS-CoV-2 by FDA under an Emergency Use Authorization (EUA). This EUA will remain in effect (meaning this test can be used) for the duration of the COVID-19 declaration under Section 564(b)(1) of the Act, 21 U.S.C. section 360bbb-3(b)(1), unless the authorization is terminated or revoked.  Performed at [facility], [address] [place], [place] [address]      Discharge Instructions:   Discharge Instructions    Amb  referral to [facility]   Complete by: As directed    Diet - low sodium heart healthy   Complete by: As directed    Discharge instructions   Complete by: As directed    Follow-up with your primary care physician in 1 week.  Follow-up with cardiology as scheduled on3/11/2020.  Your dose of thyroid medication has increased.  You have been prescribed blood thinners.  Take precautions while on blood thinner.   Increase activity slowly   Complete by: As directed    No overexertion.     Allergies as of 12/24/2020   No Known Allergies     Medication List    TAKE these  medications   acetaminophen 500 MG tablet Commonly known as: TYLENOL Take 500 mg by mouth every 6 (six) hours as needed for mild pain.   apixaban 2.5 MG Tabs tablet Commonly known as: ELIQUIS Take 1 tablet (2.5 mg total) by mouth 2 (two) times daily.   CALTRATE 600 PO Take 1 tablet by mouth daily.   cholecalciferol 25 MCG (1000 UNIT) tablet Commonly known as: VITAMIN D3 Take 1,000 Units by mouth daily.   levothyroxine 25 MCG tablet Commonly known as: SYNTHROID Take 2 tablets (50 mcg total) by mouth daily before breakfast. What changed:   how much to take  when to take this   metoprolol tartrate 50 MG tablet Commonly known as: LOPRESSOR Take 1 tablet (50 mg total) by mouth 2 (two) times daily.   sertraline 50 MG tablet Commonly known as: ZOLOFT Take 50 mg by mouth daily.       Follow-up Information    [name], MD Follow up in 1 week(s).   Specialty: Family Medicine Contact information: [address] [place] [address] [phone]        Ronney Asters, NP Follow up on 12/31/2020.   Specialty: Cardiology Why: at 10:45 Contact information: [address] [place] [address] [place] [phone]                Time coordinating discharge: 39 minutes  Signed:  Laxman Pokhrel  [facility] 12/24/2020, 4:25 PM

## 2020-12-24 NOTE — TOC Benefit Eligibility Note (Signed)
Transition of Care [facility]) Benefit Eligibility Note    Patient Details  Name: Diana Lee MRN: [id number] Date of Birth: [birth year]   Medication/Dose: ELIQUIS  2.5 MG BID  CO-PAY- $47.00   OR    ELIQUIS  5 MG BID  CO=PAY$47.00  Covered?: Yes  Tier: 3 Drug  Prescription Coverage Preferred Pharmacy: [facility]  Spoke with Person/Company/Phone Number:: [name]  @ [employer] #  [phone]  Co-Pay: $47.00  Prior Approval: No  Deductible: Met  Additional Notes: [place] $37.00    Memory Argue Phone Number: 12/24/2020, 12:49 PM

## 2020-12-24 NOTE — Plan of Care (Signed)
Problem: Education: Goal: Knowledge of General Education information will improve Description: Including pain rating scale, medication(s)/side effects and non-pharmacologic comfort measures 12/24/2020 1623 by Durward Fortes, RN Outcome: Adequate for Discharge 12/24/2020 [phone] by Durward Fortes, RN Outcome: Progressing   Problem: Health Behavior/Discharge Planning: Goal: Ability to manage health-related needs will improve 12/24/2020 1623 by Durward Fortes, RN Outcome: Adequate for Discharge 12/24/2020 3810 by Durward Fortes, RN Outcome: Progressing   Problem: Clinical Measurements: Goal: Ability to maintain clinical measurements within normal limits will improve 12/24/2020 1623 by Durward Fortes, RN Outcome: Adequate for Discharge 12/24/2020 1751 by Durward Fortes, RN Outcome: Progressing Goal: Will remain free from infection 12/24/2020 1623 by Durward Fortes, RN Outcome: Adequate for Discharge 12/24/2020 [phone] by Durward Fortes, RN Outcome: Progressing Goal: Diagnostic test results will improve 12/24/2020 1623 by Durward Fortes, RN Outcome: Adequate for Discharge 12/24/2020 5277 by Durward Fortes, RN Outcome: Progressing Goal: Respiratory complications will improve 12/24/2020 1623 by Durward Fortes, RN Outcome: Adequate for Discharge 12/24/2020 8242 by Durward Fortes, RN Outcome: Progressing Goal: Cardiovascular complication will be avoided 12/24/2020 1623 by Durward Fortes, RN Outcome: Adequate for Discharge 12/24/2020 0752 by Durward Fortes, RN Outcome: Progressing   Problem: Activity: Goal: Risk for activity intolerance will decrease 12/24/2020 1623 by Durward Fortes, RN Outcome: Adequate for Discharge 12/24/2020 [phone] by Durward Fortes, RN Outcome: Progressing   Problem: Nutrition: Goal: Adequate nutrition will be maintained 12/24/2020 1623 by Durward Fortes, RN Outcome: Adequate for Discharge 12/24/2020 [phone] by Durward Fortes, RN Outcome: Progressing   Problem:  Coping: Goal: Level of anxiety will decrease 12/24/2020 1623 by Durward Fortes, RN Outcome: Adequate for Discharge 12/24/2020 1540 by Durward Fortes, RN Outcome: Progressing   Problem: Elimination: Goal: Will not experience complications related to bowel motility 12/24/2020 1623 by Durward Fortes, RN Outcome: Adequate for Discharge 12/24/2020 [phone] by Durward Fortes, RN Outcome: Progressing Goal: Will not experience complications related to urinary retention 12/24/2020 1623 by Durward Fortes, RN Outcome: Adequate for Discharge 12/24/2020 6195 by Durward Fortes, RN Outcome: Progressing   Problem: Pain Managment: Goal: General experience of comfort will improve 12/24/2020 1623 by Durward Fortes, RN Outcome: Adequate for Discharge 12/24/2020 0932 by Durward Fortes, RN Outcome: Progressing   Problem: Safety: Goal: Ability to remain free from injury will improve 12/24/2020 1623 by Durward Fortes, RN Outcome: Adequate for Discharge 12/24/2020 6712 by Durward Fortes, RN Outcome: Progressing   Problem: Skin Integrity: Goal: Risk for impaired skin integrity will decrease 12/24/2020 1623 by Durward Fortes, RN Outcome: Adequate for Discharge 12/24/2020 4580 by Durward Fortes, RN Outcome: Progressing   Problem: Education: Goal: Knowledge of disease or condition will improve 12/24/2020 1623 by Durward Fortes, RN Outcome: Adequate for Discharge 12/24/2020 9983 by Durward Fortes, RN Outcome: Progressing Goal: Understanding of medication regimen will improve 12/24/2020 1623 by Durward Fortes, RN Outcome: Adequate for Discharge 12/24/2020 3825 by Durward Fortes, RN Outcome: Progressing Goal: Individualized Educational Video(s) 12/24/2020 1623 by Durward Fortes, RN Outcome: Adequate for Discharge 12/24/2020 [phone] by Durward Fortes, RN Outcome: Progressing   Problem: Activity: Goal: Ability to tolerate increased activity will improve 12/24/2020 1623 by Durward Fortes, RN Outcome: Adequate for  Discharge 12/24/2020 7673 by Durward Fortes, RN Outcome: Progressing   Problem: Cardiac: Goal: Ability to achieve and maintain adequate cardiopulmonary perfusion will improve 12/24/2020 1623 by  Durward Fortes, RN Outcome: Adequate for Discharge 12/24/2020 7262 by Durward Fortes, RN Outcome: Progressing   Problem: Health Behavior/Discharge Planning: Goal: Ability to safely manage health-related needs after discharge will improve 12/24/2020 1623 by Durward Fortes, RN Outcome: Adequate for Discharge 12/24/2020 0355 by Durward Fortes, RN Outcome: Progressing

## 2020-12-24 NOTE — Progress Notes (Signed)
Progress Note  Patient Name: Diana Lee Date of Encounter: 12/24/2020  Primary Cardiologist: Dr. Jacques Navy, MD   Subjective   Rates improved today>>in the 90's however remains in atrial fibrillation/flutter.   Inpatient Medications    Scheduled Meds: . apixaban  2.5 mg Oral BID  . cholecalciferol  1,000 Units Oral Daily  . levothyroxine  50 mcg Oral Q0600  . metoprolol tartrate  37.5 mg Oral BID  . sertraline  50 mg Oral Daily   Continuous Infusions:  PRN Meds: acetaminophen, ondansetron (ZOFRAN) IV   Vital Signs    Vitals:   12/23/20 1217 12/23/20 1716 12/23/20 1945 12/24/20 0600  BP: (!) 121/56 (!) 128/118  126/63  Pulse: 97 98  88  Resp: 18 17 17 18   Temp: 98.4 F (36.9 C) 98.2 F (36.8 C)  97.8 F (36.6 C)  TempSrc: Oral Oral  Oral  SpO2:  99% 97% 96%  Weight:    60.4 kg  Height:        Intake/Output Summary (Last 24 hours) at 12/24/2020 0746 Last data filed at 12/24/2020 0200 Gross per 24 hour  Intake 560 ml  Output -  Net 560 ml   Filed Weights   12/22/20 0458 12/23/20 0626 12/24/20 0600  Weight: 61.2 kg 60.5 kg 60.4 kg    Physical Exam   General: Elderly, NAD Neck: Negative for carotid bruits. No JVD Lungs:Clear to ausculation bilaterally. Breathing is unlabored. Cardiovascular: Irregularly irregular. No murmurs Abdomen: Soft, non-tender, non-distended. No obvious abdominal masses. Extremities: No edema. Radial pulses 2+ bilaterally Neuro: Alert and oriented. No focal deficits. No facial asymmetry. MAE spontaneously. Psych: Responds to questions appropriately with normal affect.    Labs    Chemistry Recent Labs  Lab 12/20/20 1214 12/21/20 0328 12/23/20 0235 12/24/20 0208  NA 139 139 139 139  K 4.3 3.9 4.2 5.1  CL 103 104 104 103  CO2 24 24 22 29   GLUCOSE 105* 97 100* 99  BUN 20 16 20  25*  CREATININE 0.92 0.82 0.89 0.98  CALCIUM 10.0 9.5 9.5 9.8  PROT 6.9  --   --   --   ALBUMIN 4.0  --   --   --   AST 39  --   --   --    ALT 25  --   --   --   ALKPHOS 63  --   --   --   BILITOT 0.8  --   --   --   GFRNONAA 60* >60 >60 55*  ANIONGAP 12 11 13 7      Hematology Recent Labs  Lab 12/22/20 0533 12/23/20 0235 12/24/20 0208  WBC 6.7 6.8 7.6  RBC 4.42 3.93 4.21  HGB 14.3 13.2 13.6  HCT 44.1 39.2 42.7  MCV 99.8 99.7 101.4*  MCH 32.4 33.6 32.3  MCHC 32.4 33.7 31.9  RDW 12.1 12.3 12.1  PLT 204 182 203    Cardiac EnzymesNo results for input(s): TROPONINI in the last 168 hours. No results for input(s): TROPIPOC in the last 168 hours.   BNP Recent Labs  Lab 12/20/20 1214  BNP 238.1*     DDimer No results for input(s): DDIMER in the last 168 hours.   Radiology    No results found.  Telemetry    12/24/20 Atrial fibrillation/flutter - Personally Reviewed  ECG    No new tracing as of 12/24/20 - Personally Reviewed  Cardiac Studies   Echo 12/21/20:  1. Left ventricular ejection fraction,  by estimation, is 60 to 65%. The  left ventricle has normal function. The left ventricle has no regional  wall motion abnormalities. Left ventricular diastolic function could not  be evaluated.  2. Right ventricular systolic function is normal. The right ventricular  size is normal. There is normal pulmonary artery systolic pressure. The  estimated right ventricular systolic pressure is 28.0 mmHg.  3. The mitral valve is abnormal. Trivial mitral valve regurgitation.  4. The aortic valve is tricuspid. Aortic valve regurgitation is not  visualized.  5. The inferior vena cava is normal in size with greater than 50%  respiratory variability, suggesting right atrial pressure of 3 mmHg.   Comparison(s): No prior Echocardiogram.   Patient Profile     87 y.o. female with a hx of hypothyroidism and depressionwho is being seen today for the evaluation of atrialflutterat the request of Dr. Katrinka Blazing  Assessment & Plan    1. Atrial flutter: -Rates improved today with titration of metoprolol>>BP with room  for further titration. Given rates remain in the 90's, will increase to 50mg  BID and follow response. Likely this will have her within target HR.  -She had bradycardia and hypotension with diltiazem  -Likely can hold off of TEE/DCCV given stabilized rates today  -MD to follow with final recs -Continue AC with Eliquis 2.5mg  BID   2. Hypothyroidism: -TSH, 10.459 on 12/20/20 -Levothyroxine increased per IM   Signed, 12/22/20 NP-C [facility] Pager: [phone] 12/24/2020, 7:46 AM     For questions or updates, please contact   Please consult [email or web address] for contact info under Cardiology/STEMI.

## 2020-12-30 NOTE — Progress Notes (Addendum)
Cardiology Clinic Note   Patient Name: Diana Lee Date of Encounter: 12/31/2020  Primary Care Provider:  [name], MD Primary Cardiologist:  Parke Poisson, MD  Patient Profile    Diana Lee 87 year old female presents the clinic today for follow-up evaluation of her atrial flutter/fibrillation.  Past Medical History    Past Medical History:  Diagnosis Date  . Depression   . Thyroid disease    History reviewed. No pertinent surgical history.  Allergies  No Known Allergies  History of Present Illness    Diana Lee has a PMH of hypothyroidism, depression, and atrial flutter/fibrillation.  Offered the option of outpatient DCCV not requiring TEE after 01/10/21.  Echocardiogram 12/21/2020 showed EF of 60-65% trivial mitral valve regurgitation.  EKG 12/24/2020 showed atrial fibrillation/flutter.  She was admitted to the hospital on 12/20/2020 and discharged on 12/24/2020.  She presented with lightheadedness.  Cardiology was asked to evaluate atrial flutter by Dr. Katrinka Blazing.  Her rates improved with metoprolol.  She was placed on apixaban 2.5 mg twice daily.  She was noted to have bradycardia and hypotension with diltiazem.  TSH was 10.459 on 12/20/2020.  She presents the clinic today for follow-up evaluation states she is trying to get back to her normal daily activities.  She notices some increased fatigue and some increased shortness of breath.  She does not feel shortness of breath at rest.  We reviewed her options for rate control, cardioversion, referral to EP.  We used shared decision-making to decide on increasing metoprolol today, following up in 2 weeks, and possibly proceeding to DCCV.  We reviewed triggers of atrial fibrillation.  She reported compliance with her apixaban.  She reports she is usually very physically active planting flowers and planting garden.  She hopes that she will be able to get back to these activities.  We will increase her metoprolol and have him  follow-up in 2 weeks.  Today she denies chest pain, shortness of breath, lower extremity edema, fatigue, palpitations, melena, hematuria, hemoptysis, diaphoresis, weakness, presyncope, syncope, orthopnea, and PND.     Home Medications    Prior to Admission medications   Medication Sig Start Date End Date Taking? Authorizing Provider  acetaminophen (TYLENOL) 500 MG tablet Take 500 mg by mouth every 6 (six) hours as needed for mild pain.    [provider]  apixaban (ELIQUIS) 2.5 MG TABS tablet Take 1 tablet (2.5 mg total) by mouth 2 (two) times daily. 12/24/20   Pokhrel, Rebekah Chesterfield, MD  Calcium Carbonate (CALTRATE 600 PO) Take 1 tablet by mouth daily.    [provider]  cholecalciferol (VITAMIN D3) 25 MCG (1000 UNIT) tablet Take 1,000 Units by mouth daily.    [provider]  levothyroxine (SYNTHROID) 25 MCG tablet Take 2 tablets (50 mcg total) by mouth daily before breakfast. 12/24/20   Pokhrel, Rebekah Chesterfield, MD  metoprolol tartrate (LOPRESSOR) 50 MG tablet Take 1 tablet (50 mg total) by mouth 2 (two) times daily. 12/24/20   Pokhrel, Rebekah Chesterfield, MD  sertraline (ZOLOFT) 50 MG tablet Take 50 mg by mouth daily. 12/13/20   [provider]    Family History    History reviewed. No pertinent family history. has no family status information on file.   Social History    Social History   Socioeconomic History  . Marital status: Married    Spouse name: Not on file  . Number of children: Not on file  . Years of education: Not on file  . Highest education level:  Not on file  Occupational History  . Not on file  Tobacco Use  . Smoking status: Never Smoker  . Smokeless tobacco: Never Used  Vaping Use  . Vaping Use: Never used  Substance and Sexual Activity  . Alcohol use: Never  . Drug use: Never  . Sexual activity: Not Currently  Other Topics Concern  . Not on file  Social History Narrative  . Not on file   Social Determinants of Health   Financial Resource  Strain: Not on file  Food Insecurity: Not on file  Transportation Needs: Not on file  Physical Activity: Not on file  Stress: Not on file  Social Connections: Not on file  Intimate Partner Violence: Not on file     Review of Systems    General:  No chills, fever, night sweats or weight changes.  Cardiovascular:  No chest pain, dyspnea on exertion, edema, orthopnea, palpitations, paroxysmal nocturnal dyspnea. Dermatological: No rash, lesions/masses Respiratory: No cough, dyspnea Urologic: No hematuria, dysuria Abdominal:   No nausea, vomiting, diarrhea, bright red blood per rectum, melena, or hematemesis Neurologic:  No visual changes, wkns, changes in mental status. All other systems reviewed and are otherwise negative except as noted above.  Physical Exam    VS:  BP 122/76 (BP Location: Left Arm, Patient Position: Sitting, Cuff Size: Normal)   Pulse (!) 101   Wt 138 lb (62.6 kg)   BMI 23.69 kg/m  , BMI Body mass index is 23.69 kg/m. GEN: Well nourished, well developed, in no acute distress. HEENT: normal. Neck: Supple, no JVD, carotid bruits, or masses. Cardiac: RRR, no murmurs, rubs, or gallops. No clubbing, cyanosis, edema.  Radials/DP/PT 2+ and equal bilaterally.  Respiratory:  Respirations regular and unlabored, clear to auscultation bilaterally. GI: Soft, nontender, nondistended, BS + x 4. MS: no deformity or atrophy. Skin: warm and dry, no rash. Neuro:  Strength and sensation are intact. Psych: Normal affect.  Accessory Clinical Findings    Recent Labs: 12/20/2020: ALT 25; B Natriuretic Peptide 238.1; TSH 10.459 12/24/2020: BUN 25; Creatinine, Ser 0.98; Hemoglobin 13.6; Magnesium 2.1; Platelets 203; Potassium 5.1; Sodium 139   Recent Lipid Panel No results found for: CHOL, TRIG, HDL, CHOLHDL, VLDL, LDLCALC, LDLDIRECT  ECG personally reviewed by me today-atrial flutter with variable AV block septal infarct undetermined age lateral infarct undetermined age 25 bpm-  No acute changes  Echocardiogram 12/21/2020 IMPRESSIONS    1. Left ventricular ejection fraction, by estimation, is 60 to 65%. The  left ventricle has normal function. The left ventricle has no regional  wall motion abnormalities. Left ventricular diastolic function could not  be evaluated.  2. Right ventricular systolic function is normal. The right ventricular  size is normal. There is normal pulmonary artery systolic pressure. The  estimated right ventricular systolic pressure is 28.0 mmHg.  3. The mitral valve is abnormal. Trivial mitral valve regurgitation.  4. The aortic valve is tricuspid. Aortic valve regurgitation is not  visualized.  5. The inferior vena cava is normal in size with greater than 50%  respiratory variability, suggesting right atrial pressure of 3 mmHg.   Comparison(s): No prior Echocardiogram.   Assessment & Plan   1.  Atrial flutter-EKG today shows atrial flutter with variable AV block septal infarct undetermined age lateral infarct undetermined age 25 bpm.  Denies any further episodes of lightheadedness, irregular heart rate or low heart rate.  Eligible for DCCV 01/10/2021.  Had bradycardia and hypotension with diltiazem. Continue  apixaban.  Increase metoprolol to  75 mg twice daily  Heart healthy low-sodium diet-salty 6 given Increase physical activity as tolerated Avoid triggers caffeine, chocolate, EtOH, dehydration etc.  Shared Decision Making/Informed Consent The risks (stroke, cardiac arrhythmias rarely resulting in the need for a temporary or permanent pacemaker, skin irritation or burns and complications associated with conscious sedation including aspiration, arrhythmia, respiratory failure and death), benefits (restoration of normal sinus rhythm) and alternatives of a direct current cardioversion were explained in detail to Ms. Baena and she agrees to proceed.    Hypothyroidism-10.4592 1822 Continue levothyroxine Follows with  PCP  Disposition: Follow-up with Dr. Duke Salvia or me in 2 weeks.  Thomasene Ripple. Roque Schill NP-C    12/31/2020, 11:04 AM [facility] [address] Office [phone] Fax [phone]  Notice: This dictation was prepared with Dragon dictation along with smaller phrase technology. Any transcriptional errors that result from this process are unintentional and may not be corrected upon review.  I spent 12 minutes examining this patient, reviewing medications, and using patient centered shared decision making involving her cardiac care.  Prior to her visit I spent greater than 20 minutes reviewing her past medical history,  medications, and prior cardiac tests.

## 2020-12-30 NOTE — H&P (View-Only) (Signed)
Cardiology Clinic Note   Patient Name: Diana Lee Date of Encounter: 12/31/2020  Primary Care Provider:  [name], MD Primary Cardiologist:  Parke Poisson, MD  Patient Profile    Diana Lee 87 year old female presents the clinic today for follow-up evaluation of her atrial flutter/fibrillation.  Past Medical History    Past Medical History:  Diagnosis Date  . Depression   . Thyroid disease    History reviewed. No pertinent surgical history.  Allergies  No Known Allergies  History of Present Illness    Diana Lee has a PMH of hypothyroidism, depression, and atrial flutter/fibrillation.  Offered the option of outpatient DCCV not requiring TEE after 01/10/21.  Echocardiogram 12/21/2020 showed EF of 60-65% trivial mitral valve regurgitation.  EKG 12/24/2020 showed atrial fibrillation/flutter.  She was admitted to the hospital on 12/20/2020 and discharged on 12/24/2020.  She presented with lightheadedness.  Cardiology was asked to evaluate atrial flutter by Dr. Katrinka Blazing.  Her rates improved with metoprolol.  She was placed on apixaban 2.5 mg twice daily.  She was noted to have bradycardia and hypotension with diltiazem.  TSH was 10.459 on 12/20/2020.  She presents the clinic today for follow-up evaluation states she is trying to get back to her normal daily activities.  She notices some increased fatigue and some increased shortness of breath.  She does not feel shortness of breath at rest.  We reviewed her options for rate control, cardioversion, referral to EP.  We used shared decision-making to decide on increasing metoprolol today, following up in 2 weeks, and possibly proceeding to DCCV.  We reviewed triggers of atrial fibrillation.  She reported compliance with her apixaban.  She reports she is usually very physically active planting flowers and planting garden.  She hopes that she will be able to get back to these activities.  We will increase her metoprolol and have him  follow-up in 2 weeks.  Today she denies chest pain, shortness of breath, lower extremity edema, fatigue, palpitations, melena, hematuria, hemoptysis, diaphoresis, weakness, presyncope, syncope, orthopnea, and PND.     Home Medications    Prior to Admission medications   Medication Sig Start Date End Date Taking? Authorizing Provider  acetaminophen (TYLENOL) 500 MG tablet Take 500 mg by mouth every 6 (six) hours as needed for mild pain.    [provider]  apixaban (ELIQUIS) 2.5 MG TABS tablet Take 1 tablet (2.5 mg total) by mouth 2 (two) times daily. 12/24/20   Pokhrel, Rebekah Chesterfield, MD  Calcium Carbonate (CALTRATE 600 PO) Take 1 tablet by mouth daily.    [provider]  cholecalciferol (VITAMIN D3) 25 MCG (1000 UNIT) tablet Take 1,000 Units by mouth daily.    [provider]  levothyroxine (SYNTHROID) 25 MCG tablet Take 2 tablets (50 mcg total) by mouth daily before breakfast. 12/24/20   Pokhrel, Rebekah Chesterfield, MD  metoprolol tartrate (LOPRESSOR) 50 MG tablet Take 1 tablet (50 mg total) by mouth 2 (two) times daily. 12/24/20   Pokhrel, Rebekah Chesterfield, MD  sertraline (ZOLOFT) 50 MG tablet Take 50 mg by mouth daily. 12/13/20   [provider]    Family History    History reviewed. No pertinent family history. has no family status information on file.   Social History    Social History   Socioeconomic History  . Marital status: Married    Spouse name: Not on file  . Number of children: Not on file  . Years of education: Not on file  . Highest education level:  Not on file  Occupational History  . Not on file  Tobacco Use  . Smoking status: Never Smoker  . Smokeless tobacco: Never Used  Vaping Use  . Vaping Use: Never used  Substance and Sexual Activity  . Alcohol use: Never  . Drug use: Never  . Sexual activity: Not Currently  Other Topics Concern  . Not on file  Social History Narrative  . Not on file   Social Determinants of Health   Financial Resource  Strain: Not on file  Food Insecurity: Not on file  Transportation Needs: Not on file  Physical Activity: Not on file  Stress: Not on file  Social Connections: Not on file  Intimate Partner Violence: Not on file     Review of Systems    General:  No chills, fever, night sweats or weight changes.  Cardiovascular:  No chest pain, dyspnea on exertion, edema, orthopnea, palpitations, paroxysmal nocturnal dyspnea. Dermatological: No rash, lesions/masses Respiratory: No cough, dyspnea Urologic: No hematuria, dysuria Abdominal:   No nausea, vomiting, diarrhea, bright red blood per rectum, melena, or hematemesis Neurologic:  No visual changes, wkns, changes in mental status. All other systems reviewed and are otherwise negative except as noted above.  Physical Exam    VS:  BP 122/76 (BP Location: Left Arm, Patient Position: Sitting, Cuff Size: Normal)   Pulse (!) 101   Wt 138 lb (62.6 kg)   BMI 23.69 kg/m  , BMI Body mass index is 23.69 kg/m. GEN: Well nourished, well developed, in no acute distress. HEENT: normal. Neck: Supple, no JVD, carotid bruits, or masses. Cardiac: RRR, no murmurs, rubs, or gallops. No clubbing, cyanosis, edema.  Radials/DP/PT 2+ and equal bilaterally.  Respiratory:  Respirations regular and unlabored, clear to auscultation bilaterally. GI: Soft, nontender, nondistended, BS + x 4. MS: no deformity or atrophy. Skin: warm and dry, no rash. Neuro:  Strength and sensation are intact. Psych: Normal affect.  Accessory Clinical Findings    Recent Labs: 12/20/2020: ALT 25; B Natriuretic Peptide 238.1; TSH 10.459 12/24/2020: BUN 25; Creatinine, Ser 0.98; Hemoglobin 13.6; Magnesium 2.1; Platelets 203; Potassium 5.1; Sodium 139   Recent Lipid Panel No results found for: CHOL, TRIG, HDL, CHOLHDL, VLDL, LDLCALC, LDLDIRECT  ECG personally reviewed by me today-atrial flutter with variable AV block septal infarct undetermined age lateral infarct undetermined age 101 bpm-  No acute changes  Echocardiogram 12/21/2020 IMPRESSIONS    1. Left ventricular ejection fraction, by estimation, is 60 to 65%. The  left ventricle has normal function. The left ventricle has no regional  wall motion abnormalities. Left ventricular diastolic function could not  be evaluated.  2. Right ventricular systolic function is normal. The right ventricular  size is normal. There is normal pulmonary artery systolic pressure. The  estimated right ventricular systolic pressure is 28.0 mmHg.  3. The mitral valve is abnormal. Trivial mitral valve regurgitation.  4. The aortic valve is tricuspid. Aortic valve regurgitation is not  visualized.  5. The inferior vena cava is normal in size with greater than 50%  respiratory variability, suggesting right atrial pressure of 3 mmHg.   Comparison(s): No prior Echocardiogram.   Assessment & Plan   1.  Atrial flutter-EKG today shows atrial flutter with variable AV block septal infarct undetermined age lateral infarct undetermined age 101 bpm.  Denies any further episodes of lightheadedness, irregular heart rate or low heart rate.  Eligible for DCCV 01/10/2021.  Had bradycardia and hypotension with diltiazem. Continue  apixaban.  Increase metoprolol to   75 mg twice daily  Heart healthy low-sodium diet-salty 6 given Increase physical activity as tolerated Avoid triggers caffeine, chocolate, EtOH, dehydration etc.  Shared Decision Making/Informed Consent The risks (stroke, cardiac arrhythmias rarely resulting in the need for a temporary or permanent pacemaker, skin irritation or burns and complications associated with conscious sedation including aspiration, arrhythmia, respiratory failure and death), benefits (restoration of normal sinus rhythm) and alternatives of a direct current cardioversion were explained in detail to Diana Lee and she agrees to proceed.    Hypothyroidism-[id number] 1822 Continue levothyroxine Follows with  PCP  Disposition: Follow-up with Dr. Duke Salvia or me in 2 weeks.  Thomasene Ripple. Shelma Eiben NP-C    12/31/2020, 11:04 AM [facility] [address] Office [phone] Fax [phone]  Notice: This dictation was prepared with Dragon dictation along with smaller phrase technology. Any transcriptional errors that result from this process are unintentional and may not be corrected upon review.  I spent 12 minutes examining this patient, reviewing medications, and using patient centered shared decision making involving her cardiac care.  Prior to her visit I spent greater than 20 minutes reviewing her past medical history,  medications, and prior cardiac tests.

## 2020-12-31 ENCOUNTER — Other Ambulatory Visit: Payer: Self-pay | Admitting: General Practice

## 2020-12-31 ENCOUNTER — Telehealth: Payer: Self-pay

## 2020-12-31 ENCOUNTER — Ambulatory Visit: Payer: Medicare Other | Admitting: General Practice

## 2020-12-31 ENCOUNTER — Other Ambulatory Visit: Payer: Self-pay

## 2020-12-31 ENCOUNTER — Encounter: Payer: Self-pay | Admitting: General Practice

## 2020-12-31 VITALS — BP 122/76 | HR 101 | Wt 138.0 lb

## 2020-12-31 DIAGNOSIS — I483 Typical atrial flutter: Secondary | ICD-10-CM | POA: Diagnosis not present

## 2020-12-31 DIAGNOSIS — E038 Other specified hypothyroidism: Secondary | ICD-10-CM | POA: Diagnosis not present

## 2020-12-31 DIAGNOSIS — Z01812 Encounter for preprocedural laboratory examination: Secondary | ICD-10-CM

## 2020-12-31 MED ORDER — METOPROLOL TARTRATE 50 MG PO TABS: 75.0000 mg | ORAL_TABLET | Freq: Two times a day (BID) | ORAL | 2 refills | Status: DC

## 2020-12-31 MED ORDER — METOPROLOL TARTRATE 50 MG PO TABS: 75.0000 mg | ORAL_TABLET | Freq: Two times a day (BID) | ORAL | 1 refills | Status: DC

## 2020-12-31 NOTE — Telephone Encounter (Signed)
-----   Message from Parke Poisson, MD sent at 12/31/2020  1:20 PM EST ----- Verdon Cummins, did you set her up for cardioversion? Can you please set her up for cardioversion with me on March 15? That's my next day doing DCCV.  Eileen Stanford, Please move her appointment from March 16 out a week or two with me. Thanks, Anselm Jungling ----- Message ----- From: Ronney Asters, NP Sent: 12/31/2020  11:06 AM EST To: Parke Poisson, MD

## 2020-12-31 NOTE — Telephone Encounter (Signed)
Follow Up:     Daughter is calling back to schedule pt's procedure.

## 2020-12-31 NOTE — Patient Instructions (Signed)
Medication Instructions:  INCREASE METOPROLOL 75MG  DAILY (1-1/2 TAB) *If you need a refill on your cardiac medications before your next appointment, please call your pharmacy*  Lab Work:   Testing/Procedures:  NONE    NONE  Special Instructions PLEASE INCREASE PHYSICAL ACTIVITY AS TOLERATED  Follow-Up: Your next appointment:  2 week(s) In Person with , MD OR IF UNAVAILABLE JESSE CLEAVER, FNP-C  At [facility], you and your health needs are our priority.  As part of our continuing mission to provide you with exceptional heart care, we have created designated Provider Care Teams.  These Care Teams include your primary Cardiologist (physician) and Advanced Practice Providers (APPs -  Physician Assistants and Nurse Practitioners) who all work together to provide you with the care you need, when you need it.  We recommend signing up for the patient portal called "MyChart".  Sign up information is provided on this After Visit Summary.  MyChart is used to connect with patients for Virtual Visits (Telemedicine).  Patients are able to view lab/test results, encounter notes, upcoming appointments, etc.  Non-urgent messages can be sent to your provider as well.   To learn more about what you can do with MyChart, go to [facility].

## 2020-12-31 NOTE — Addendum Note (Signed)
Addended by: Alyson Ingles on: 12/31/2020 12:07 PM   Modules accepted: Orders

## 2020-12-31 NOTE — Telephone Encounter (Signed)
Spoke with patient, patient would like to discuss with her daughter prior to scheduling the cardioversion. Advised patient to call back to office when ready and that I will get everything set up for her. Number to the office given to patient. Patient verbalized understanding.

## 2020-12-31 NOTE — Addendum Note (Signed)
Addended by: Ronney Asters on: 12/31/2020 04:35 PM   Modules accepted: Orders, SmartSet

## 2020-12-31 NOTE — Telephone Encounter (Signed)
Returned call to patient and patients daughter (who was present at patients appointment today with Edd Fabian) regarding scheduling patients cardioversion. Patient would like to go ahead and schedule this for 3/15 with Dr. Jacques Navy.   Cardioversion scheduled for 01/14/21 at 10am with Dr. Jacques Navy. Order placed for BMET and CBC to be completed 1 week prior to Procedure.   Covid test scheduled for 01/10/21 at 12:30pm at [facility] [place].  Patient and patients daughter verbalized understanding of all instructions.   Letter of instructions and lab slips printed and mailed to patient.

## 2021-01-10 ENCOUNTER — Other Ambulatory Visit (HOSPITAL_COMMUNITY)
Admission: RE | Admit: 2021-01-10 | Discharge: 2021-01-10 | Disposition: A | Discharge status: Home / Self Care | Payer: Medicare Other | Source: Ambulatory Visit | Attending: Internal Medicine | Admitting: Internal Medicine

## 2021-01-10 DIAGNOSIS — Z01812 Encounter for preprocedural laboratory examination: Secondary | ICD-10-CM | POA: Insufficient documentation

## 2021-01-10 DIAGNOSIS — Z20822 Contact with and (suspected) exposure to covid-19: Secondary | ICD-10-CM | POA: Diagnosis not present

## 2021-01-10 LAB — SARS CORONAVIRUS 2 (TAT 6-24 HRS): SARS Coronavirus 2: NEGATIVE

## 2021-01-11 LAB — BASIC METABOLIC PANEL
BUN/Creatinine Ratio: 19 (ref 12–28)
BUN: 18 mg/dL (ref 8–27)
CO2: 25 mmol/L (ref 20–29)
Calcium: 9.6 mg/dL (ref 8.7–10.3)
Chloride: 100 mmol/L (ref 96–106)
Creatinine, Ser: 0.95 mg/dL (ref 0.57–1.00)
Glucose: 74 mg/dL (ref 65–99)
Potassium: 4.8 mmol/L (ref 3.5–5.2)
Sodium: 142 mmol/L (ref 134–144)
eGFR: 57 mL/min/{1.73_m2} — ABNORMAL LOW (ref >59)

## 2021-01-11 LAB — CBC
Hematocrit: 38.3 % (ref 34.0–46.6)
Hemoglobin: 12.5 g/dL (ref 11.1–15.9)
MCH: 32.5 pg (ref 26.6–33.0)
MCHC: 32.6 g/dL (ref 31.5–35.7)
MCV: 100 fL — ABNORMAL HIGH (ref 79–97)
Platelets: 259 10*3/uL (ref 150–450)
RBC: 3.85 x10E6/uL (ref 3.77–5.28)
RDW: 11.7 % (ref 11.7–15.4)
WBC: 9.3 10*3/uL (ref 3.4–10.8)

## 2021-01-13 DIAGNOSIS — I4891 Unspecified atrial fibrillation: Secondary | ICD-10-CM | POA: Diagnosis not present

## 2021-01-13 DIAGNOSIS — E039 Hypothyroidism, unspecified: Secondary | ICD-10-CM | POA: Diagnosis not present

## 2021-01-13 DIAGNOSIS — D6869 Other thrombophilia: Secondary | ICD-10-CM | POA: Diagnosis not present

## 2021-01-14 ENCOUNTER — Ambulatory Visit (HOSPITAL_COMMUNITY)
Admission: RE | Admit: 2021-01-14 | Discharge: 2021-01-14 | Disposition: A | Discharge status: Home / Self Care | Payer: Medicare Other | Attending: Internal Medicine | Admitting: Internal Medicine

## 2021-01-14 ENCOUNTER — Ambulatory Visit (HOSPITAL_COMMUNITY): Payer: Medicare Other | Admitting: Anesthesiology

## 2021-01-14 ENCOUNTER — Encounter (HOSPITAL_COMMUNITY): Payer: Self-pay | Admitting: Internal Medicine

## 2021-01-14 ENCOUNTER — Other Ambulatory Visit: Payer: Self-pay

## 2021-01-14 ENCOUNTER — Encounter (HOSPITAL_COMMUNITY)
Admission: RE | Disposition: A | Discharge status: Home / Self Care | Payer: Self-pay | Source: Home / Self Care | Attending: Internal Medicine

## 2021-01-14 DIAGNOSIS — Z7901 Long term (current) use of anticoagulants: Secondary | ICD-10-CM | POA: Insufficient documentation

## 2021-01-14 DIAGNOSIS — Z79899 Other long term (current) drug therapy: Secondary | ICD-10-CM | POA: Insufficient documentation

## 2021-01-14 DIAGNOSIS — Z79890 Hormone replacement therapy (postmenopausal): Secondary | ICD-10-CM | POA: Insufficient documentation

## 2021-01-14 DIAGNOSIS — I484 Atypical atrial flutter: Secondary | ICD-10-CM | POA: Diagnosis not present

## 2021-01-14 DIAGNOSIS — F32A Depression, unspecified: Secondary | ICD-10-CM | POA: Diagnosis not present

## 2021-01-14 DIAGNOSIS — E039 Hypothyroidism, unspecified: Secondary | ICD-10-CM | POA: Diagnosis not present

## 2021-01-14 DIAGNOSIS — I4892 Unspecified atrial flutter: Secondary | ICD-10-CM | POA: Insufficient documentation

## 2021-01-14 HISTORY — PX: CARDIOVERSION: SHX1299

## 2021-01-14 SURGERY — CARDIOVERSION
Anesthesia: General

## 2021-01-14 MED ORDER — APIXABAN 2.5 MG PO TABS
2.5000 mg | ORAL_TABLET | Freq: Once | ORAL | Status: AC
  Administered 2021-01-14: 2.5 mg via ORAL
  Filled 2021-01-14: qty 1

## 2021-01-14 MED ORDER — SODIUM CHLORIDE 0.9 % IV SOLN
INTRAVENOUS | Status: AC | PRN
  Administered 2021-01-14: 500 mL via INTRAVENOUS

## 2021-01-14 MED ORDER — EPHEDRINE SULFATE 50 MG/ML IJ SOLN
INTRAMUSCULAR | Status: DC | PRN
  Administered 2021-01-14: 10 mg via INTRAVENOUS

## 2021-01-14 MED ORDER — PROPOFOL 10 MG/ML IV BOLUS
INTRAVENOUS | Status: DC | PRN
  Administered 2021-01-14: 50 mg via INTRAVENOUS

## 2021-01-14 MED ORDER — PHENYLEPHRINE HCL (PRESSORS) 10 MG/ML IV SOLN
INTRAVENOUS | Status: DC | PRN
  Administered 2021-01-14: 80 ug via INTRAVENOUS

## 2021-01-14 MED ORDER — LIDOCAINE 2% (20 MG/ML) 5 ML SYRINGE
INTRAMUSCULAR | Status: DC | PRN
  Administered 2021-01-14: 60 mg via INTRAVENOUS

## 2021-01-14 NOTE — Anesthesia Procedure Notes (Signed)
Procedure Name: General with mask airway Performed by: Ligia Duguay B, CRNA Pre-anesthesia Checklist: Patient identified, Emergency Drugs available, Suction available, Patient being monitored and Timeout performed Patient Re-evaluated:Patient Re-evaluated prior to induction Oxygen Delivery Method: Ambu bag Preoxygenation: Pre-oxygenation with 100% oxygen Induction Type: IV induction Ventilation: Mask ventilation without difficulty Placement Confirmation: positive ETCO2 Dental Injury: Teeth and Oropharynx as per pre-operative assessment        

## 2021-01-14 NOTE — Anesthesia Preprocedure Evaluation (Addendum)
Anesthesia Evaluation  Patient identified by MRN, date of birth, ID band Patient awake    Reviewed: Allergy & Precautions, NPO status , Patient's Chart, lab work & pertinent test results  Airway Mallampati: II  TM Distance: >3 FB Neck ROM: Full    Dental  (+) Dental Advisory Given, Teeth Intact   Pulmonary neg pulmonary ROS,    Pulmonary exam normal breath sounds clear to auscultation       Cardiovascular + dysrhythmias Atrial Fibrillation  Rhythm:Irregular Rate:Tachycardia  Echo 12/2020  1. Left ventricular ejection fraction, by estimation, is 60 to 65%. The left ventricle has normal function. The left ventricle has no regional wall motion abnormalities. Left ventricular diastolic function could not be evaluated.  2. Right ventricular systolic function is normal. The right ventricular size is normal. There is normal pulmonary artery systolic pressure. The estimated right ventricular systolic pressure is 28.0 mmHg.  3. The mitral valve is abnormal. Trivial mitral valve regurgitation.  4. The aortic valve is tricuspid. Aortic valve regurgitation is not visualized.  5. The inferior vena cava is normal in size with greater than 50% respiratory variability, suggesting right atrial pressure of 3 mmHg.    Neuro/Psych PSYCHIATRIC DISORDERS Depression negative neurological ROS     GI/Hepatic negative GI ROS, Neg liver ROS,   Endo/Other  Hypothyroidism   Renal/GU negative Renal ROS     Musculoskeletal negative musculoskeletal ROS (+)   Abdominal   Peds  Hematology negative hematology ROS (+)   Anesthesia Other Findings   Reproductive/Obstetrics                            Anesthesia Physical Anesthesia Plan  ASA: III  Anesthesia Plan: General   Post-op Pain Management:    Induction: Intravenous  PONV Risk Score and Plan: 3 and Propofol infusion, TIVA and Treatment may vary due to age or  medical condition  Airway Management Planned: Natural Airway and Mask  Additional Equipment:   Intra-op Plan:   Post-operative Plan: Extubation in OR  Informed Consent: I have reviewed the patients History and Physical, chart, labs and discussed the procedure including the risks, benefits and alternatives for the proposed anesthesia with the patient or authorized representative who has indicated his/her understanding and acceptance.     Dental advisory given  Plan Discussed with: CRNA  Anesthesia Plan Comments:         Anesthesia Quick Evaluation

## 2021-01-14 NOTE — Transfer of Care (Signed)
Immediate Anesthesia Transfer of Care Note  Patient: Diana Lee  Procedure(s) Performed: CARDIOVERSION (N/A )  Patient Location: Endoscopy Unit  Anesthesia Type:General  Level of Consciousness: awake, alert  and oriented  Airway & Oxygen Therapy: Patient Spontanous Breathing  Post-op Assessment: Report given to RN and Post -op Vital signs reviewed and stable  Post vital signs: Reviewed and stable  Last Vitals:  Vitals Value Taken Time  BP    Temp    Pulse    Resp    SpO2      Last Pain:  Vitals:   01/14/21 0910  TempSrc: Temporal  PainSc: 0-No pain         Complications: No complications documented.

## 2021-01-14 NOTE — Interval H&P Note (Signed)
History and Physical Interval Note:  01/14/2021 9:14 AM  Diana Lee  has presented today for surgery, with the diagnosis of afib.  The various methods of treatment have been discussed with the patient and family. After consideration of risks, benefits and other options for treatment, the patient has consented to  Procedure(s): CARDIOVERSION (N/A) as a surgical intervention.  The patient's history has been reviewed, patient examined, no change in status, stable for surgery.  I have reviewed the patient's chart and labs.  Questions were answered to the patient's satisfaction.     Parke Poisson

## 2021-01-14 NOTE — CV Procedure (Signed)
Procedure: Electrical Cardioversion Indications:  Atrial Flutter  Procedure Details:  Consent: Risks of procedure as well as the alternatives and risks of each were explained to the (patient/caregiver).  Consent for procedure obtained.  Time Out: Verified patient identification, verified procedure, site/side was marked, verified correct patient position, special equipment/implants available, medications/allergies/relevent history reviewed, required imaging and test results available. PERFORMED.  Patient placed on cardiac monitor, pulse oximetry, supplemental oxygen as necessary.  Sedation given: propofol Pacer pads placed anterior and posterior chest.  Cardioverted 1 time(s).  Cardioversion with synchronized biphasic 120J shock.  Evaluation: Findings: Post procedure EKG shows: NSR Complications: Hypotensive and bradycardic requiring phenylephrine and ephedrine dosed per anesthesia.  Patient did tolerate procedure well otherwise.  Time Spent Directly with the Patient:  45 minutes   Parke Poisson 01/14/2021, 10:33 AM

## 2021-01-14 NOTE — Discharge Instructions (Signed)
YOU HAD AN CARDIAC PROCEDURE TODAY: Refer to the procedure report and other information in the discharge instructions given to you for any specific questions about what was found during the examination. If this information does not answer your questions, please call [facility] office at [phone] to clarify.   DIET: Your first meal following the procedure should be a light meal and then it is ok to progress to your normal diet. A half-sandwich or bowl of soup is an example of a good first meal. Heavy or fried foods are harder to digest and may make you feel nauseous or bloated. Drink plenty of fluids but you should avoid alcoholic beverages for 24 hours. If you had a esophageal dilation, please see attached instructions for diet.   ACTIVITY: Your care partner should take you home directly after the procedure. You should plan to take it easy, moving slowly for the rest of the day. You can resume normal activity the day after the procedure however YOU SHOULD NOT DRIVE, use power tools, machinery or perform tasks that involve climbing or major physical exertion for 24 hours (because of the sedation medicines used during the test).   SYMPTOMS TO REPORT IMMEDIATELY: A cardiologist can be reached at any hour. Please call [phone] for any of the following symptoms:   New, significant abdominal pain  New, significant chest pain or pain under the shoulder blades  New shortness of breath   Electrical Cardioversion Electrical cardioversion is the delivery of a jolt of electricity to restore a normal rhythm to the heart. A rhythm that is too fast or is not regular keeps the heart from pumping well. In this procedure, sticky patches or metal paddles are placed on the chest to deliver electricity to the heart from a device. This procedure may be done in an emergency if:  There is low or no blood pressure as a result of the heart rhythm.  Normal rhythm must be restored as fast as possible to  protect the brain and heart from further damage.  It may save a life. This may also be a scheduled procedure for irregular or fast heart rhythms that are not immediately life-threatening. Tell a health care provider about:  Any allergies you have.  All medicines you are taking, including vitamins, herbs, eye drops, creams, and over-the-counter medicines.  Any problems you or family members have had with anesthetic medicines.  Any blood disorders you have.  Any surgeries you have had.  Any medical conditions you have.  Whether you are pregnant or may be pregnant. What are the risks? Generally, this is a safe procedure. However, problems may occur, including:  Allergic reactions to medicines.  A blood clot that breaks free and travels to other parts of your body.  The possible return of an abnormal heart rhythm within hours or days after the procedure.  Your heart stopping (cardiac arrest). This is rare. What happens before the procedure? Medicines  Your health care provider may have you start taking: ? Blood-thinning medicines (anticoagulants) so your blood does not clot as easily. ? Medicines to help stabilize your heart rate and rhythm.  Ask your health care provider about: ? Changing or stopping your regular medicines. This is especially important if you are taking diabetes medicines or blood thinners. ? Taking medicines such as aspirin and ibuprofen. These medicines can thin your blood. Do not take these medicines unless your health care provider tells you to take them. ? Taking over-the-counter medicines, vitamins, herbs, and supplements.  General instructions  Follow instructions from your health care provider about eating or drinking restrictions.  Plan to have someone take you home from the hospital or clinic.  If you will be going home right after the procedure, plan to have someone with you for 24 hours.  Ask your health care provider what steps will be taken  to help prevent infection. These may include washing your skin with a germ-killing soap. What happens during the procedure?  An IV will be inserted into one of your veins.  Sticky patches (electrodes) or metal paddles may be placed on your chest.  You will be given a medicine to help you relax (sedative).  An electrical shock will be delivered. The procedure may vary among health care providers and hospitals.   What can I expect after the procedure?  Your blood pressure, heart rate, breathing rate, and blood oxygen level will be monitored until you leave the hospital or clinic.  Your heart rhythm will be watched to make sure it does not change.  You may have some redness on the skin where the shocks were given. Follow these instructions at home:  Do not drive for 24 hours if you were given a sedative during your procedure.  Take over-the-counter and prescription medicines only as told by your health care provider.  Ask your health care provider how to check your pulse. Check it often.  Rest for 48 hours after the procedure or as told by your health care provider.  Avoid or limit your caffeine use as told by your health care provider.  Keep all follow-up visits as told by your health care provider. This is important. Contact a health care provider if:  You feel like your heart is beating too quickly or your pulse is not regular.  You have a serious muscle cramp that does not go away. Get help right away if:  You have discomfort in your chest.  You are dizzy or you feel faint.  You have trouble breathing or you are short of breath.  Your speech is slurred.  You have trouble moving an arm or leg on one side of your body.  Your fingers or toes turn cold or blue. Summary  Electrical cardioversion is the delivery of a jolt of electricity to restore a normal rhythm to the heart.  This procedure may be done right away in an emergency or may be a scheduled procedure if the  condition is not an emergency.  Generally, this is a safe procedure.  After the procedure, check your pulse often as told by your health care provider. This information is not intended to replace advice given to you by your health care provider. Make sure you discuss any questions you have with your health care provider. Document Revised: 05/22/2019 Document Reviewed: 05/22/2019 Elsevier Patient Education  2021 [employer]   FOLLOW UP:  Please also call with any specific questions about appointments or follow up tests.  Hold metoprolol until after evaluation with cardiology tomorrow.

## 2021-01-15 ENCOUNTER — Encounter: Payer: Self-pay | Admitting: Internal Medicine

## 2021-01-15 ENCOUNTER — Ambulatory Visit: Payer: Medicare Other | Admitting: Internal Medicine

## 2021-01-15 ENCOUNTER — Other Ambulatory Visit: Payer: Self-pay

## 2021-01-15 VITALS — BP 140/70 | HR 54 | Ht 65.0 in | Wt 142.0 lb

## 2021-01-15 DIAGNOSIS — I4892 Unspecified atrial flutter: Secondary | ICD-10-CM | POA: Diagnosis not present

## 2021-01-15 DIAGNOSIS — E038 Other specified hypothyroidism: Secondary | ICD-10-CM | POA: Diagnosis not present

## 2021-01-15 NOTE — Progress Notes (Deleted)
po

## 2021-01-15 NOTE — Anesthesia Postprocedure Evaluation (Signed)
Anesthesia Post Note  Patient: Diana Lee  Procedure(s) Performed: CARDIOVERSION (N/A )     Patient location during evaluation: Endoscopy Anesthesia Type: General Level of consciousness: sedated and patient cooperative Pain management: pain level controlled Vital Signs Assessment: post-procedure vital signs reviewed and stable Respiratory status: spontaneous breathing Cardiovascular status: stable Anesthetic complications: no   No complications documented.  Last Vitals:  Vitals:   01/14/21 1050 01/14/21 1053  BP:    Pulse: 65 75  Resp: 12 14  Temp:    SpO2: 100% 99%    Last Pain:  Vitals:   01/14/21 1037  TempSrc:   PainSc: 0-No pain                 Lewie Loron

## 2021-01-15 NOTE — Patient Instructions (Signed)
Medication Instructions:  No Changes In Medications at this time.  *If you need a refill on your cardiac medications before your next appointment, please call your pharmacy*  Follow-Up: At [facility], you and your health needs are our priority.  As part of our continuing mission to provide you with exceptional heart care, we have created designated Provider Care Teams.  These Care Teams include your primary Cardiologist (physician) and Advanced Practice Providers (APPs -  Physician Assistants and Nurse Practitioners) who all work together to provide you with the care you need, when you need it.  We recommend signing up for the patient portal called "MyChart".  Sign up information is provided on this After Visit Summary.  MyChart is used to connect with patients for Virtual Visits (Telemedicine).  Patients are able to view lab/test results, encounter notes, upcoming appointments, etc.  Non-urgent messages can be sent to your provider as well.   To learn more about what you can do with MyChart, go to [email or web address].    Your next appointment:   April 26th at 11:40 am  The format for your next appointment:   In Person  Provider:   Weston Brass, MD

## 2021-01-15 NOTE — Progress Notes (Signed)
Cardiology Office Note:    Date:  01/15/2021   ID:  Diana Lee, DOB 26-Dec-1928, MRN [id number]  PCP:  [name], MD  Cardiologist:  Parke Poisson, MD  Electrophysiologist:  None   Referring MD: [name], MD   Chief Complaint/Reason for Referral: Atrial flutter  History of Present Illness:    Diana Lee is a 87 y.o. female with a history of hypothyroidism and atrial flutter who presents for close follow up after DCCV yesterday.   She had successful DCCV yesterday, with conversion to sinus rhythm after 1 shock at 120 J. However immediate after restoration of sinus rhythm she had significant sinus bradycardia and hypotension requiring phenylephrine and ephedrine. She was stable at time of discharge. I stopped her metoprolol yesterday given hypotension/bradycardia.   She is maintaining sinus rhythm today, with bradycardia. Mild hypertension today which we will tolerate in the short term given degree of bradycardia yesterday with sedation.   Daughter Diana Lee is with her today. Patient remains tired and feels weak. We discussed yesterday's procedure and that this may be impacting symptoms.      Past Medical History:  Diagnosis Date  . Depression   . Thyroid disease     No past surgical history on file.  Current Medications: Current Meds  Medication Sig  . acetaminophen (TYLENOL) 500 MG tablet Take 500 mg by mouth every 6 (six) hours as needed for mild pain.  Marland Kitchen apixaban (ELIQUIS) 2.5 MG TABS tablet Take 1 tablet (2.5 mg total) by mouth 2 (two) times daily.  . Calcium Carbonate (CALTRATE 600 PO) Take 1 tablet by mouth daily.  . cholecalciferol (VITAMIN D3) 25 MCG (1000 UNIT) tablet Take 1,000 Units by mouth daily.  Marland Kitchen levothyroxine (SYNTHROID) 50 MCG tablet Take 50 mcg by mouth daily before breakfast.  . sertraline (ZOLOFT) 50 MG tablet Take 50 mg by mouth daily.     Allergies:   Patient has no known allergies.   Social History   Tobacco Use  . Smoking  status: Never Smoker  . Smokeless tobacco: Never Used  Vaping Use  . Vaping Use: Never used  Substance Use Topics  . Alcohol use: Never  . Drug use: Never     Family History: The patient's family history is not on file.  ROS:   Please see the history of present illness.    All other systems reviewed and are negative.  EKGs/Labs/Other Studies Reviewed:    The following studies were reviewed today:  EKG:  Sinus bradycardia, PACs, septal infarct pattern.   Recent Labs: 12/20/2020: ALT 25; B Natriuretic Peptide 238.1; TSH 10.459 12/24/2020: Magnesium 2.1 01/10/2021: BUN 18; Creatinine, Ser 0.95; Hemoglobin 12.5; Platelets 259; Potassium 4.8; Sodium 142  Recent Lipid Panel No results found for: CHOL, TRIG, HDL, CHOLHDL, VLDL, LDLCALC, LDLDIRECT  Physical Exam:    VS:  BP 140/70 (BP Location: Left Arm, Patient Position: Sitting)   Pulse (!) 54   Ht 5\' 5"  (1.651 m)   Wt 142 lb (64.4 kg)   SpO2 99%   BMI 23.63 kg/m     Wt Readings from Last 5 Encounters:  01/15/21 142 lb (64.4 kg)  01/14/21 138 lb (62.6 kg)  12/31/20 138 lb (62.6 kg)  12/24/20 133 lb 2.5 oz (60.4 kg)    Constitutional: No acute distress Eyes: sclera non-icteric, normal conjunctiva and lids ENMT: normal dentition, moist mucous membranes Cardiovascular: regular rhythm, bradycardic rate, no murmurs. S1 and S2 normal. Radial pulses normal bilaterally. No jugular venous distention.  Respiratory: clear to auscultation bilaterally GI : normal bowel sounds, soft and nontender. No distention.   MSK: extremities warm, well perfused. No edema.  NEURO: grossly nonfocal exam, moves all extremities. PSYCH: alert and oriented x 3, normal mood and affect.   ASSESSMENT:    1. Atrial flutter, unspecified type (HCC)   2. Other specified hypothyroidism    PLAN:    Atrial flutter, unspecified type (HCC) - Plan: EKG 12-Lead - maintaining sinus rhythm. Continue eliquis 2.5 mg BID. Still weak and tired after cardioversion  but we will monitor this closely. Follow up in 6 weeks. Hold rate control for now, she is bradycardic.  Other specified hypothyroidism - per patient TSH has normalized with treatment. This was likely the nidus for onset of atrial flutter.   Total time of encounter: 29 minutes total time of encounter, including 15 minutes spent in face-to-face patient care on the date of this encounter. This time includes coordination of care and counseling regarding above mentioned problem list. Remainder of non-face-to-face time involved reviewing chart documents/testing relevant to the patient encounter and documentation in the medical record. I have independently reviewed documentation from referring provider.   Weston Brass, MD, [facility]  [facility]    Medication Adjustments/Labs and Tests Ordered: Current medicines are reviewed at length with the patient today.  Concerns regarding medicines are outlined above.   Orders Placed This Encounter  Procedures  . EKG 12-Lead    No orders of the defined types were placed in this encounter.   Patient Instructions  Medication Instructions:  No Changes In Medications at this time.  *If you need a refill on your cardiac medications before your next appointment, please call your pharmacy*  Follow-Up: At [facility], you and your health needs are our priority.  As part of our continuing mission to provide you with exceptional heart care, we have created designated Provider Care Teams.  These Care Teams include your primary Cardiologist (physician) and Advanced Practice Providers (APPs -  Physician Assistants and Nurse Practitioners) who all work together to provide you with the care you need, when you need it.  We recommend signing up for the patient portal called "MyChart".  Sign up information is provided on this After Visit Summary.  MyChart is used to connect with patients for Virtual Visits (Telemedicine).  Patients are able to view  lab/test results, encounter notes, upcoming appointments, etc.  Non-urgent messages can be sent to your provider as well.   To learn more about what you can do with MyChart, go to [email or web address].    Your next appointment:   April 26th at 11:40 am  The format for your next appointment:   In Person  Provider:   Weston Brass, MD

## 2021-01-20 ENCOUNTER — Telehealth: Payer: Self-pay | Admitting: Internal Medicine

## 2021-01-20 NOTE — Telephone Encounter (Signed)
Returned call to patients daughter [name] (okay per DPR) who states that patient has some swelling in her ankles and feet. Per patients daughter patient does not have a reliable scale. Patients daughter states that she will take her scale to patients house and let her use that to monitor her weight.    Per patients daughter, patient denies any other symptoms at this time. Per patients daughter, patients recent BP was 115/69 with HR 69. Advised patients daughter to have patient elevate legs, and that I would forward message to Dr. Jacques Navy for recommendations and advice. patients daughter verbalized understanding.

## 2021-01-20 NOTE — Telephone Encounter (Signed)
Pt c/o swelling: STAT is pt has developed SOB within 24 hours  1) How much weight have you gained and in what time span? N/A  2) If swelling, where is the swelling located? Both feet and ankles  3) Are you currently taking a fluid pill? No  4) Are you currently SOB? No  5) Do you have a log of your daily weights (if so, list)? No  6) Have you gained 3 pounds in a day or 5 pounds in a week? Not sure  7) Have you traveled recently? No

## 2021-01-22 MED ORDER — FUROSEMIDE 20 MG PO TABS: 20.0000 mg | ORAL_TABLET | Freq: Every day | ORAL | 0 refills | Status: DC | PRN

## 2021-01-22 NOTE — Telephone Encounter (Signed)
Ok to give prn 20 mg po lasix for swelling. Let's check on her Monday by phone and see how she's feeling. OK To continue lasix ongoing if it helps, but if she does that then please get labs in 10 days from today.  Thanks

## 2021-01-22 NOTE — Telephone Encounter (Signed)
Returned call to patients daughter (okay per DPR), made patients daughter aware of Dr. Lupe Carney recommendations. Advised patients daughter that Lasix 20mg  daily PRN has been called into patient preferred pharmacy. Advised patients daughter to have patient weigh her self daily and write that number down. Advised to return call to office for weight gain of 3 or more pounds over night or 5 or more pounds in one week. Advised patients daughter to call back Monday to let Thursday know how patient is doing. Patients daughter verbalized understanding of all instructions.

## 2021-01-22 NOTE — Addendum Note (Signed)
Addended by: Bea Laura B on: 01/22/2021 05:33 PM   Modules accepted: Orders

## 2021-01-28 NOTE — Telephone Encounter (Signed)
Attempted to call patients daughter [name] (okay per DPR) for update on patient. Left message for patients daughter to call back to the office when able.

## 2021-01-29 ENCOUNTER — Ambulatory Visit: Payer: Medicare Other | Admitting: Internal Medicine

## 2021-01-29 NOTE — Telephone Encounter (Signed)
Spoke with patients daughter [name] (okay per DPR), per patients daughter patient is doing very well and has not needed the lasix at all. Per patients daughter patient has no swelling and and feels well at this time with no complaints. Advised Becky to return call to office with any issues, or concerns. Patients daughter verbalized understanding.

## 2021-02-10 DIAGNOSIS — Z85828 Personal history of other malignant neoplasm of skin: Secondary | ICD-10-CM | POA: Diagnosis not present

## 2021-02-10 DIAGNOSIS — L905 Scar conditions and fibrosis of skin: Secondary | ICD-10-CM | POA: Diagnosis not present

## 2021-02-25 ENCOUNTER — Encounter: Payer: Self-pay | Admitting: Internal Medicine

## 2021-02-25 ENCOUNTER — Other Ambulatory Visit: Payer: Self-pay

## 2021-02-25 ENCOUNTER — Ambulatory Visit: Payer: Medicare Other | Admitting: Internal Medicine

## 2021-02-25 VITALS — BP 144/70 | HR 70 | Ht 65.0 in | Wt 137.6 lb

## 2021-02-25 DIAGNOSIS — I4892 Unspecified atrial flutter: Secondary | ICD-10-CM

## 2021-02-25 DIAGNOSIS — E038 Other specified hypothyroidism: Secondary | ICD-10-CM | POA: Diagnosis not present

## 2021-02-25 NOTE — Patient Instructions (Signed)
Medication Instructions:  Your physician recommends that you continue on your current medications as directed. Please refer to the Current Medication list given to you today.  *If you need a refill on your cardiac medications before your next appointment, please call your pharmacy*   Follow-Up: At [facility], you and your health needs are our priority.  As part of our continuing mission to provide you with exceptional heart care, we have created designated Provider Care Teams.  These Care Teams include your primary Cardiologist (physician) and Advanced Practice Providers (APPs -  Physician Assistants and Nurse Practitioners) who all work together to provide you with the care you need, when you need it.  We recommend signing up for the patient portal called "MyChart".  Sign up information is provided on this After Visit Summary.  MyChart is used to connect with patients for Virtual Visits (Telemedicine).  Patients are able to view lab/test results, encounter notes, upcoming appointments, etc.  Non-urgent messages can be sent to your provider as well.   To learn more about what you can do with MyChart, go to [email or web address].    Your next appointment:   3 month(s)  The format for your next appointment:   In Person  Provider:   Weston Brass, MD   Other Instructions Please call us with any issues [phone].

## 2021-02-25 NOTE — Progress Notes (Signed)
Cardiology Office Note:    Date:  02/25/2021   ID:  Diana Lee, DOB 16-May-1929, MRN [id number]  PCP:  Diana Raider, MD  Cardiologist:  Diana Poisson, MD  Electrophysiologist:  None   Referring MD: Diana Raider, MD   Chief Complaint/Reason for Referral: Atrial flutter  History of Present Illness:    Diana Lee is a 87 y.o. female with a history of hypothyroidism and atrial flutter who presents for close follow up. Daughter Diana Lee joins her for visit.  Remains tired but doing well overall. BP is stable and she is maintaining sinus rhythm. We discussed returning to her usual activities but expecting to build her strength back after several days in the hospital given her age. She understands and will push forward with strength building, but slowly.   The patient denies chest pain, chest pressure, dyspnea at rest or with exertion, palpitations, PND, orthopnea, or leg swelling. Denies cough, fever, chills. Denies nausea, vomiting. Denies syncope or presyncope. Denies dizziness or lightheadedness.   Past Medical History:  Diagnosis Date  . Depression   . Thyroid disease     Past Surgical History:  Procedure Laterality Date  . CARDIOVERSION N/A 01/14/2021   Procedure: CARDIOVERSION;  Surgeon: Diana Poisson, MD;  Location: [facility] ENDOSCOPY;  Service: Cardiovascular;  Laterality: N/A;    Current Medications: Current Meds  Medication Sig  . acetaminophen (TYLENOL) 500 MG tablet Take 500 mg by mouth every 6 (six) hours as needed for mild pain.  Marland Kitchen apixaban (ELIQUIS) 2.5 MG TABS tablet Take 1 tablet (2.5 mg total) by mouth 2 (two) times daily.  . Calcium Carbonate (CALTRATE 600 PO) Take 1 tablet by mouth daily.  . cholecalciferol (VITAMIN D3) 25 MCG (1000 UNIT) tablet Take 1,000 Units by mouth daily.  Marland Kitchen levothyroxine (SYNTHROID) 50 MCG tablet Take 50 mcg by mouth daily before breakfast.  . sertraline (ZOLOFT) 50 MG tablet Take 50 mg by mouth daily.  . [DISCONTINUED]  furosemide (LASIX) 20 MG tablet Take 1 tablet (20 mg total) by mouth daily as needed for edema. Take 1 Tablet (20mg  total) by mouth once daily for swelling or shortness of breath.  . [DISCONTINUED] levothyroxine (SYNTHROID) 50 MCG tablet TAKE 1 TABLET (50 MCG TOTAL) BY MOUTH DAILY BEFORE BREAKFAST.  . [DISCONTINUED] metoprolol tartrate (LOPRESSOR) 50 MG tablet TAKE 1.5 TABLETS (75 MG TOTAL) BY MOUTH TWO TIMES DAILY.  . [DISCONTINUED] metoprolol tartrate (LOPRESSOR) 50 MG tablet TAKE 1 TABLET (50 MG TOTAL) BY MOUTH 2 (TWO) TIMES DAILY.     Allergies:   Patient has no allergy information on record.   Social History   Tobacco Use  . Smoking status: Never Smoker  . Smokeless tobacco: Never Used  Vaping Use  . Vaping Use: Never used  Substance Use Topics  . Alcohol use: Never  . Drug use: Never     Family History: The patient's family history is not on file.  ROS:   Please see the history of present illness.    All other systems reviewed and are negative.  EKGs/Labs/Other Studies Reviewed:    The following studies were reviewed today:  EKG:  NSR   Recent Labs: 12/20/2020: ALT 25; B Natriuretic Peptide 238.1; TSH 10.459 12/24/2020: Magnesium 2.1 01/10/2021: BUN 18; Creatinine, Ser 0.95; Hemoglobin 12.5; Platelets 259; Potassium 4.8; Sodium 142  Recent Lipid Panel No results found for: CHOL, TRIG, HDL, CHOLHDL, VLDL, LDLCALC, LDLDIRECT  Physical Exam:    VS:  BP (!) 144/70   Pulse 70  Ht 5\' 5"  (1.651 m)   Wt 137 lb 9.6 oz (62.4 kg)   SpO2 98%   BMI 22.90 kg/m     Wt Readings from Last 5 Encounters:  02/25/21 137 lb 9.6 oz (62.4 kg)  01/15/21 142 lb (64.4 kg)  01/14/21 138 lb (62.6 kg)  12/31/20 138 lb (62.6 kg)  12/24/20 133 lb 2.5 oz (60.4 kg)    Constitutional: No acute distress Eyes: sclera non-icteric, normal conjunctiva and lids ENMT: normal dentition, moist mucous membranes Cardiovascular: regular rhythm, normal rate, no murmurs. S1 and S2 normal. Radial  pulses normal bilaterally. No jugular venous distention.  Respiratory: clear to auscultation bilaterally GI : normal bowel sounds, soft and nontender. No distention.   MSK: extremities warm, well perfused. No edema.  NEURO: grossly nonfocal exam, moves all extremities. PSYCH: alert and oriented x 3, normal mood and affect.   ASSESSMENT:    1. Atrial flutter, unspecified type (HCC)   2. Other specified hypothyroidism    PLAN:    Atrial flutter, unspecified type (HCC) - Plan: EKG 12-Lead  Other specified hypothyroidism   Doing well overall. Slow to regain strength after hospitalization, this may take some time. Recommended resuming moderate physical activity and avoid overdoing exertion.   Maintaining sinus rhythm. Continue eliquis 2.5 mg BID. Hold rate control for now.   Levothyroxine being adjusted by PMD.  Total time of encounter: 20 minutes total time of encounter, including 15 minutes spent in face-to-face patient care on the date of this encounter. This time includes coordination of care and counseling regarding above mentioned problem list. Remainder of non-face-to-face time involved reviewing chart documents/testing relevant to the patient encounter and documentation in the medical record. I have independently reviewed documentation from referring provider.   12/26/20, MD, [facility]  [facility]    Medication Adjustments/Labs and Tests Ordered: Current medicines are reviewed at length with the patient today.  Concerns regarding medicines are outlined above.   Orders Placed This Encounter  Procedures  . EKG 12-Lead    No orders of the defined types were placed in this encounter.   Patient Instructions  Medication Instructions:  Your physician recommends that you continue on your current medications as directed. Please refer to the Current Medication list given to you today.  *If you need a refill on your cardiac medications before your next  appointment, please call your pharmacy*   Follow-Up: At [facility], you and your health needs are our priority.  As part of our continuing mission to provide you with exceptional heart care, we have created designated Provider Care Teams.  These Care Teams include your primary Cardiologist (physician) and Advanced Practice Providers (APPs -  Physician Assistants and Nurse Practitioners) who all work together to provide you with the care you need, when you need it.  We recommend signing up for the patient portal called "MyChart".  Sign up information is provided on this After Visit Summary.  MyChart is used to connect with patients for Virtual Visits (Telemedicine).  Patients are able to view lab/test results, encounter notes, upcoming appointments, etc.  Non-urgent messages can be sent to your provider as well.   To learn more about what you can do with MyChart, go to [facility].    Your next appointment:   3 month(s)  The format for your next appointment:   In Person  Provider:   [email or web address], MD   Other Instructions Please call Weston Brass with any issues [phone].

## 2021-03-24 ENCOUNTER — Other Ambulatory Visit: Payer: Self-pay | Admitting: Internal Medicine

## 2021-03-24 MED ORDER — APIXABAN 2.5 MG PO TABS: 2.5000 mg | ORAL_TABLET | Freq: Two times a day (BID) | ORAL | 0 refills | Status: DC

## 2021-03-24 NOTE — Telephone Encounter (Signed)
Agree pt should increase dose to 5mg  BID based on age, weight, and SCr. A few inpatient notes from her recent hospitalization mentioned her weight being borderline for dose, she was right around 132-133 lbs at that time but has been consistently higher after discharge (138-142 lbs). Please increase Eliquis to 5mg  BID and let pt know of dose change.

## 2021-03-24 NOTE — Telephone Encounter (Signed)
*  STAT* If patient is at the pharmacy, call can be transferred to refill team.   1. Which medications need to be refilled? (please list name of each medication and dose if known) Eliquis- completely out of it  2. Which pharmacy/location (including street and city if local pharmacy) is medication to be sent to? [facility] [address], [place]*  3. Do they need a 30 day or 90 day supply? 90 days and refills- need asap please, she is completely out of it

## 2021-03-24 NOTE — Telephone Encounter (Signed)
Spoke with pt's daughter who states she does not want to increase patient's Eliquis dose because she was advised that her mom was doing so well and didn't need any medication changes recently. Advised her that since her weight has consistently increased above 132 lbs (60kg cutoff for Eliquis dosing) after her recent discharge, she now qualifies for higher dosing. A few inpatient cardiology and pharmacy notes on 12/21/20 mention her weight being right at the 132 lb cutoff inpatient when she was on the 2.5mg  BID dosing, with potential need for dose increase pending her weight.  Understand her concern given pt's advanced age, however also discussed the need to therapeutically anticoagulate her to reduce her risk of stroke. ([facility] recently had a pt die from a stroke who was in her 90s and under-anticoagulated on Eliquis due to concern over advanced age).  Daughter requests input from Dr Jacques Navy regarding Eliquis dosing. Pt is completely out of Eliquis now, did send in 1 month rx of 2.5mg  dosing per daughter's request.

## 2021-03-24 NOTE — Telephone Encounter (Signed)
Pt requesting refill of eliquis 2.5mg  but qualifies for 5mg  will route to pharmd pool as urgent since pt is currently out of med. 20f, 62.4kg, scr 0.95 01/10/21, lovw/acharya 02/25/21

## 2021-03-26 ENCOUNTER — Telehealth: Payer: Self-pay | Admitting: Pharmacist

## 2021-03-26 MED ORDER — APIXABAN 5 MG PO TABS: 5.0000 mg | ORAL_TABLET | Freq: Two times a day (BID) | ORAL | 5 refills | Status: DC

## 2021-03-26 NOTE — Telephone Encounter (Signed)
Documented in phone encounter as well - dtr called back and aware of Eliquis dose increase that Dr Jacques Navy is in agreement with.

## 2021-03-26 NOTE — Telephone Encounter (Signed)
Me  1:27 PM Left message for pt's daughter.      Parke Poisson, MD to Me     11:56 AM Agree with you, would follow your recommendations. I'm out of office, if any other issues arise we will reach out to patient next week.   Mar 24, 2021  Me to Parke Poisson, MD     4:04 PM Daughter requesting input from you, thanks!    Me  4:03 PM Note Spoke with pt's daughter who states she does not want to increase patient's Eliquis dose because she was advised that her mom was doing so well and didn't need any medication changes recently. Advised her that since her weight has consistently increased above 132 lbs (60kg cutoff for Eliquis dosing) after her recent discharge, she now qualifies for higher dosing. A few inpatient cardiology and pharmacy notes on 12/21/20 mention her weight being right at the 132 lb cutoff inpatient when she was on the 2.5mg  BID dosing, with potential need for dose increase pending her weight.  Understand her concern given pt's advanced age, however also discussed the need to therapeutically anticoagulate her to reduce her risk of stroke. ([facility] recently had a pt die from a stroke who was in her 90s and under-anticoagulated on Eliquis due to concern over advanced age).  Daughter requests input from Dr Jacques Navy regarding Eliquis dosing. Pt is completely out of Eliquis now, did send in 1 month rx of 2.5mg  dosing per daughter's request.       Me    3:49 PM Note Agree pt should increase dose to 5mg  BID based on age, weight, and SCr. A few inpatient notes from her recent hospitalization mentioned her weight being borderline for dose, she was right around 132-133 lbs at that time but has been consistently higher after discharge (138-142 lbs). Please increase Eliquis to 5mg  BID and let pt know of dose change.         , CMA    2:58 PM Note Pt requesting refill of eliquis 2.5mg  but qualifies for 5mg  will route to pharmd pool as  urgent since pt is currently out of med. 23f, 62.4kg, scr 0.95 01/10/21, lovw/acharya 02/25/21        89f M   SW  12:56 PM Note  *STAT* If patient is at the pharmacy, call can be transferred to refill team.   1. Which medications need to be refilled? (please list name of each medication and dose if known) Eliquis- completely out of it  2. Which pharmacy/location (including street and city if local pharmacy) is medication to be sent to? [facility] [address], [place]  3. Do they need a 30 day or 90 day supply? 90 days and refills- need asap please, she is completely out of it

## 2021-03-26 NOTE — Telephone Encounter (Signed)
Left message for pt's daughter.  

## 2021-03-26 NOTE — Telephone Encounter (Signed)
Samples placed for eliquis 5mg  2 weeks

## 2021-03-26 NOTE — Telephone Encounter (Signed)
Spoke with pt's daughter. She understands rationale for increasing Eliquis dose and is aware that if pt's weight consistently drops down to the 120s in the future, then her dose would be reduced again. Pt did just pick up rx for 2.5mg  last night since pt was out of med. She will double up on her current supply, and will leave 2 weeks worth of Eliquis 5mg  samples at the [facility] office since her pharmacy won't be able to process the 5mg  dose for a month or so.

## 2021-03-28 DIAGNOSIS — D6869 Other thrombophilia: Secondary | ICD-10-CM | POA: Diagnosis not present

## 2021-03-28 DIAGNOSIS — R636 Underweight: Secondary | ICD-10-CM | POA: Diagnosis not present

## 2021-03-28 DIAGNOSIS — E039 Hypothyroidism, unspecified: Secondary | ICD-10-CM | POA: Diagnosis not present

## 2021-03-28 DIAGNOSIS — I4891 Unspecified atrial fibrillation: Secondary | ICD-10-CM | POA: Diagnosis not present

## 2021-04-03 ENCOUNTER — Telehealth: Payer: Self-pay | Admitting: Internal Medicine

## 2021-04-03 NOTE — Telephone Encounter (Signed)
Returned call to patients daughter [name] (okay per DPR) who states that patients blood pressure has been running low for her. Patients daughter states that patients recent BP's have been 104/70, and 109/70. Patients daughter states that patients heart rate has also been 133 for multiple checks on her BP cuff. Patients daughter reports that the patient has been fatigued, and lightheaded/dizzy at times. Per patients daughter patient has not had any shortness of breath, swelling, or chest pain.   Made patient an appointment with A-Fib clinic tomorrow 6/3 at 1:30pm. Made patients daughter aware of instructions to A-Fib clinic.  Made patients daughter aware of ED precautions should patient develop new or worsening symptoms. Patients daughter verbalized understanding of all instructions.   Spoke with Dr. Jacques Navy as well who agrees with plan.

## 2021-04-03 NOTE — Telephone Encounter (Signed)
PT's daughter is calling to speak with [name] she states that [name] told her anytime her mother is having problems to give her a call

## 2021-04-04 ENCOUNTER — Encounter (HOSPITAL_COMMUNITY): Payer: Self-pay | Admitting: Emergency Medicine

## 2021-04-04 ENCOUNTER — Emergency Department (HOSPITAL_COMMUNITY): Payer: Medicare Other

## 2021-04-04 ENCOUNTER — Inpatient Hospital Stay (HOSPITAL_COMMUNITY)
Admission: EM | Admit: 2021-04-04 | Discharge: 2021-04-08 | DRG: 274 | Disposition: A | Discharge status: Home / Self Care | Payer: Medicare Other | Attending: Internal Medicine | Admitting: Internal Medicine

## 2021-04-04 ENCOUNTER — Other Ambulatory Visit: Payer: Self-pay

## 2021-04-04 ENCOUNTER — Ambulatory Visit (HOSPITAL_COMMUNITY): Payer: Medicare Other | Admitting: Physician Assistant

## 2021-04-04 ENCOUNTER — Telehealth: Payer: Self-pay | Admitting: Internal Medicine

## 2021-04-04 DIAGNOSIS — E039 Hypothyroidism, unspecified: Secondary | ICD-10-CM | POA: Diagnosis not present

## 2021-04-04 DIAGNOSIS — Z79890 Hormone replacement therapy (postmenopausal): Secondary | ICD-10-CM | POA: Diagnosis not present

## 2021-04-04 DIAGNOSIS — R000 Tachycardia, unspecified: Secondary | ICD-10-CM | POA: Diagnosis not present

## 2021-04-04 DIAGNOSIS — Z20822 Contact with and (suspected) exposure to covid-19: Secondary | ICD-10-CM | POA: Diagnosis present

## 2021-04-04 DIAGNOSIS — Z7901 Long term (current) use of anticoagulants: Secondary | ICD-10-CM | POA: Diagnosis not present

## 2021-04-04 DIAGNOSIS — Z79899 Other long term (current) drug therapy: Secondary | ICD-10-CM

## 2021-04-04 DIAGNOSIS — N179 Acute kidney failure, unspecified: Secondary | ICD-10-CM | POA: Diagnosis not present

## 2021-04-04 DIAGNOSIS — D7589 Other specified diseases of blood and blood-forming organs: Secondary | ICD-10-CM | POA: Diagnosis present

## 2021-04-04 DIAGNOSIS — R42 Dizziness and giddiness: Secondary | ICD-10-CM | POA: Diagnosis not present

## 2021-04-04 DIAGNOSIS — I4892 Unspecified atrial flutter: Secondary | ICD-10-CM | POA: Diagnosis not present

## 2021-04-04 DIAGNOSIS — F32A Depression, unspecified: Secondary | ICD-10-CM | POA: Diagnosis not present

## 2021-04-04 DIAGNOSIS — I483 Typical atrial flutter: Secondary | ICD-10-CM | POA: Diagnosis not present

## 2021-04-04 DIAGNOSIS — E860 Dehydration: Secondary | ICD-10-CM | POA: Diagnosis not present

## 2021-04-04 DIAGNOSIS — R0902 Hypoxemia: Secondary | ICD-10-CM | POA: Diagnosis not present

## 2021-04-04 HISTORY — DX: Unspecified atrial flutter: I48.92

## 2021-04-04 LAB — CBC
HCT: 46.2 % — ABNORMAL HIGH (ref 36.0–46.0)
Hemoglobin: 14.7 g/dL (ref 12.0–15.0)
MCH: 32.9 pg (ref 26.0–34.0)
MCHC: 31.8 g/dL (ref 30.0–36.0)
MCV: 103.4 fL — ABNORMAL HIGH (ref 80.0–100.0)
Platelets: 165 10*3/uL (ref 150–400)
RBC: 4.47 MIL/uL (ref 3.87–5.11)
RDW: 12.3 % (ref 11.5–15.5)
WBC: 7.3 10*3/uL (ref 4.0–10.5)
nRBC: 0 % (ref 0.0–0.2)

## 2021-04-04 LAB — BASIC METABOLIC PANEL
Anion gap: 9 (ref 5–15)
BUN: 17 mg/dL (ref 8–23)
CO2: 21 mmol/L — ABNORMAL LOW (ref 22–32)
Calcium: 8.4 mg/dL — ABNORMAL LOW (ref 8.9–10.3)
Chloride: 111 mmol/L (ref 98–111)
Creatinine, Ser: 0.71 mg/dL (ref 0.44–1.00)
GFR, Estimated: >60 mL/min (ref >60)
Glucose, Bld: 92 mg/dL (ref 70–99)
Potassium: 3.8 mmol/L (ref 3.5–5.1)
Sodium: 141 mmol/L (ref 135–145)

## 2021-04-04 LAB — RESP PANEL BY RT-PCR (FLU A&B, COVID) ARPGX2
Influenza A by PCR: NEGATIVE
Influenza B by PCR: NEGATIVE
SARS Coronavirus 2 by RT PCR: NEGATIVE

## 2021-04-04 LAB — TSH: TSH: 4.231 u[IU]/mL (ref 0.350–4.500)

## 2021-04-04 MED ORDER — LEVOTHYROXINE SODIUM 25 MCG PO TABS: 50.0000 ug | ORAL_TABLET | Freq: Every day | ORAL | Status: DC

## 2021-04-04 MED ORDER — SERTRALINE HCL 50 MG PO TABS
50.0000 mg | ORAL_TABLET | Freq: Every day | ORAL | Status: DC
  Administered 2021-04-05 – 2021-04-08 (×4): 50 mg via ORAL
  Filled 2021-04-04 (×5): qty 1

## 2021-04-04 MED ORDER — SERTRALINE HCL 50 MG PO TABS: 50.0000 mg | ORAL_TABLET | Freq: Every day | ORAL | Status: DC

## 2021-04-04 MED ORDER — ACETAMINOPHEN 325 MG PO TABS: 650.0000 mg | ORAL_TABLET | ORAL | Status: DC | PRN

## 2021-04-04 MED ORDER — LEVOTHYROXINE SODIUM 50 MCG PO TABS
50.0000 ug | ORAL_TABLET | Freq: Every day | ORAL | Status: DC
  Administered 2021-04-05 – 2021-04-08 (×4): 50 ug via ORAL
  Filled 2021-04-04 (×4): qty 1

## 2021-04-04 MED ORDER — APIXABAN 5 MG PO TABS
5.0000 mg | ORAL_TABLET | Freq: Two times a day (BID) | ORAL | Status: DC
  Administered 2021-04-04 – 2021-04-08 (×9): 5 mg via ORAL
  Filled 2021-04-04 (×10): qty 1

## 2021-04-04 MED ORDER — ONDANSETRON HCL 4 MG/2ML IJ SOLN: 4.0000 mg | Freq: Four times a day (QID) | INTRAMUSCULAR | Status: DC | PRN

## 2021-04-04 NOTE — H&P (Addendum)
Cardiology Admission History and Physical:   Patient ID: Diana Lee MRN: [id number]; DOB: 08/09/1929   Admission date: 04/04/2021  PCP:  [name], MD   [facility] Providers Cardiologist:  Parke Poisson, MD        Chief Complaint:  Dizziness and weakness  Patient Profile:   Diana Lee is a 87 y.o. female with past medical history of hypothyroidism, depression and recently diagnosed atrial flutter who is being seen 04/04/2021 for the evaluation of 2:1 atrial flutter with RVR.  History of Present Illness:   [name] is a 87 year old female with past medical history of hypothyroidism, depression and recently diagnosed atrial flutter.  Patient was initially diagnosed with atrial flutter during her admission in February 2022.  She was placed on metoprolol and Eliquis at the time.  She had hypotension with diltiazem.  Echocardiogram obtained on 12/21/2020 showed EF 60 to 65%, trivial MR.  During atrial flutter, patient has significant weakness and orthostatic dizziness.  Initial TSH was elevated to 10.459.  Patient was eventually discharged with plan to proceed with outpatient cardioversion.  On follow-up in March 2022, she was seen by Edd Fabian, NP, heart rate was borderline elevated, therefore her metoprolol was further increased to 75 mg twice a day.  She underwent successful cardioversion on 01/14/2021 after taking anticoagulation therapy for at least 3 weeks.  Procedure was complicated by bradycardia and hypotension afterward required phenylephrine and ephedrine dosed by anesthesia.  Blood pressure eventually improved enough she was discharged and followed up by Dr. Jacques Navy in the office on the following day.  Postprocedure, her strength gradually returned.  Metoprolol was discontinued prior to her follow-up on 02/25/2021, it is unclear why metoprolol was discontinued. More recently, patient has increased Eliquis from the previous 2.5 mg twice a day to 5 mg twice a day starting on  03/26/2021 at the recommendation of our clinical pharmacist as her weight has persistently covered above 60 kg.  The day before yesterday, she went out to pick up her mail.  On her way back, she started having dizziness and weakness that is reminiscent of her symptom with previous atrial flutter.  She was scheduled to see atrial fibrillation clinic today at 1:30 PM, however due to persistent symptoms, she decided to come into the emergency room.  On arrival, she is locked in 2-1 atrial flutter with a heart rate in the 130s.   Past Medical History:  Diagnosis Date  . Atrial flutter (HCC)   . Depression   . Thyroid disease     Past Surgical History:  Procedure Laterality Date  . CARDIOVERSION N/A 01/14/2021   Procedure: CARDIOVERSION;  Surgeon: Parke Poisson, MD;  Location: [facility] ENDOSCOPY;  Service: Cardiovascular;  Laterality: N/A;     Medications Prior to Admission: Prior to Admission medications   Medication Sig Start Date End Date Taking? Authorizing Provider  acetaminophen (TYLENOL) 500 MG tablet Take 500 mg by mouth every 6 (six) hours as needed for mild pain.    [provider]  apixaban (ELIQUIS) 5 MG TABS tablet Take 1 tablet (5 mg total) by mouth 2 (two) times daily. 03/26/21   Parke Poisson, MD  Calcium Carbonate (CALTRATE 600 PO) Take 1 tablet by mouth daily.    [provider]  cholecalciferol (VITAMIN D3) 25 MCG (1000 UNIT) tablet Take 1,000 Units by mouth daily.    [provider]  levothyroxine (SYNTHROID) 50 MCG tablet Take 50 mcg by mouth daily before breakfast. 12/24/20   [provider]  sertraline (ZOLOFT) 50 MG tablet Take 50 mg by mouth daily. 12/13/20   [provider]     Allergies:   Not on File  Social History:   Social History   Socioeconomic History  . Marital status: Married    Spouse name: Not on file  . Number of children: Not on file  . Years of education: Not on file  . Highest education level: Not  on file  Occupational History  . Not on file  Tobacco Use  . Smoking status: Never Smoker  . Smokeless tobacco: Never Used  Vaping Use  . Vaping Use: Never used  Substance and Sexual Activity  . Alcohol use: Never  . Drug use: Never  . Sexual activity: Not Currently  Other Topics Concern  . Not on file  Social History Narrative  . Not on file   Social Determinants of Health   Financial Resource Strain: Not on file  Food Insecurity: Not on file  Transportation Needs: Not on file  Physical Activity: Not on file  Stress: Not on file  Social Connections: Not on file  Intimate Partner Violence: Not on file    Family History:   The patient's family history is not on file.    ROS:  Please see the history of present illness.  All other ROS reviewed and negative.     Physical Exam/Data:   Vitals:   04/04/21 1156 04/04/21 1202 04/04/21 1230 04/04/21 1245  BP:   (!) 142/92 121/83  Pulse:   (!) 133 (!) 132  Resp:   13 13  Temp:      TempSrc:      SpO2: 96%  98% 98%  Weight:  62.1 kg    Height:  5\' 5"  (1.651 m)      Intake/Output Summary (Last 24 hours) at 04/04/2021 1334 Last data filed at 04/04/2021 1156 Gross per 24 hour  Intake 250 ml  Output --  Net 250 ml   Last 3 Weights 04/04/2021 02/25/2021 01/15/2021  Weight (lbs) 137 lb 137 lb 9.6 oz 142 lb  Weight (kg) 62.143 kg 62.415 kg 64.411 kg     Body mass index is 22.8 kg/m.  General:  Well nourished, well developed, in no acute distress HEENT: normal Lymph: no adenopathy Neck: no JVD Endocrine:  No thryomegaly Vascular: No carotid bruits; FA pulses 2+ bilaterally without bruits  Cardiac:  normal S1, S2; tachycardic; no murmur  Lungs:  clear to auscultation bilaterally, no wheezing, rhonchi or rales  Abd: soft, nontender, no hepatomegaly  Ext: no edema Musculoskeletal:  No deformities, BUE and BLE strength normal and equal Skin: warm and dry  Neuro:  CNs 2-12 intact, no focal abnormalities noted Psych:  Normal  affect    EKG:  The ECG that was done and was personally reviewed and demonstrates 2:1 atrial flutter  Relevant CV Studies:  Echo 12/21/2020 1. Left ventricular ejection fraction, by estimation, is 60 to 65%. The  left ventricle has normal function. The left ventricle has no regional  wall motion abnormalities. Left ventricular diastolic function could not  be evaluated.  2. Right ventricular systolic function is normal. The right ventricular  size is normal. There is normal pulmonary artery systolic pressure. The  estimated right ventricular systolic pressure is 28.0 mmHg.  3. The mitral valve is abnormal. Trivial mitral valve regurgitation.  4. The aortic valve is tricuspid. Aortic valve regurgitation is not  visualized.  5. The inferior vena cava is normal  in size with greater than 50%  respiratory variability, suggesting right atrial pressure of 3 mmHg.   Comparison(s): No prior Echocardiogram.   Laboratory Data:  High Sensitivity Troponin:  No results for input(s): TROPONINIHS in the last 720 hours.    ChemistryNo results for input(s): NA, K, CL, CO2, GLUCOSE, BUN, CREATININE, CALCIUM, GFRNONAA, GFRAA, ANIONGAP in the last 168 hours.  No results for input(s): PROT, ALBUMIN, AST, ALT, ALKPHOS, BILITOT in the last 168 hours. Hematology Recent Labs  Lab 04/04/21 1207  WBC 7.3  RBC 4.47  HGB 14.7  HCT 46.2*  MCV 103.4*  MCH 32.9  MCHC 31.8  RDW 12.3  PLT 165   BNPNo results for input(s): BNP, PROBNP in the last 168 hours.  DDimer No results for input(s): DDIMER in the last 168 hours.   Radiology/Studies:  DG Chest Port 1 View  Result Date: 04/04/2021 CLINICAL DATA:  Atrial flutter EXAM: PORTABLE CHEST 1 VIEW COMPARISON:  12/20/2020 FINDINGS: The heart size and mediastinal contours are within normal limits. Both lungs are clear. The visualized skeletal structures are unremarkable. IMPRESSION: No active disease. Electronically Signed   By: Marlan Palau M.D.   On:  04/04/2021 12:57     Assessment and Plan:   1. Atrial flutter with RVR  -Normal EF on echocardiogram in February 2022, recently underwent successful cardioversion on 01/14/2021.  Cardioversion was complicated by hypotension and bradycardia afterward.  -Previous metoprolol tartrate 75 mg twice a day has been stopped in April for unknown reason.  Eliquis dosage increased on 03/26/2021 given her weight has above 60 kg since discharge.  -Her last dose of Eliquis was last night.  She denies any recent palpitation or chest pain, she started having weakness and orthostatic dizziness with feeling of passing out upon standing started 2 days ago while picking up her mail.  -With her symptomatic 2-1 atrial flutter, hesitant to add beta-blocker again.  She did not tolerate the diltiazem in February due to hypotension and bradycardia.  Will discuss with MD, consider start on IV amiodarone.  Potentially discharge her later on p.o. amiodarone to help maintain sinus rhythm as outpatient.  Alternative would be DC cardioversion, however patient is not interested in this.  2. Hypothyroidism: On levothyroxine  3. Depression: On Zoloft.   Risk Assessment/Risk Scores:         CHA2DS2-VASc Score = 3  This indicates a 3.2% annual risk of stroke. The patient's score is based upon: CHF History: No HTN History: No Diabetes History: No Stroke History: No Vascular Disease History: No Age Score: 2 Gender Score: 1      Severity of Illness: The appropriate patient status for this patient is OBSERVATION. Observation status is judged to be reasonable and necessary in order to provide the required intensity of service to ensure the patient's safety. The patient's presenting symptoms, physical exam findings, and initial radiographic and laboratory data in the context of their medical condition is felt to place them at decreased risk for further clinical deterioration. Furthermore, it is anticipated that the patient  will be medically stable for discharge from the hospital within 2 midnights of admission. The following factors support the patient status of observation.   " The patient's presenting symptoms include dizziness and presyncope. " The physical exam findings include tachycardic. " The initial radiographic and laboratory data are atrial flutter on EKG.     For questions or updates, please contact [facility] Please consult [email or web address] for contact info under     Signed,  Azalee Course, [place]  04/04/2021 1:34 PM    I have seen and examined the patient along with Azalee Course, PA .  I have reviewed the chart, notes and new data.  I agree with PA/NP's note.  Key new complaints: Although she is unaware of palpitations, when she is in flutter she has fatigue, exertional dyspnea and feels "like she will topple over". No angina or dyspnea at rest and no full syncope. She is a very active 87 yo, lives independently and grows her own vegetable garden. Key examination changes: rapid regular rhythm, otherwise normal CV exam Key new findings / data: TSH was mildly elevated at 10 in Feb, but normal T4. Mild macrocytosis without anemia, otherwise normal labs  PLAN: Despite her age, she is otherwise very healthy and functional has recurrent symptomatic atrial flutter, which has proved to be very hard to rate control. Discussed the pros and cons of antiarrhythmics and radiofrequency ablation. Considering the side effects that she has had with beta blockers and calcium channel blockers, she is biased against medications with high side effect profile such as antiarrhythmics. She is a little skeptical about RF ablation due to her age, but this may be the best option overall. Will ask Dr. Ladona Ridgel for an EP evaluation. She is fully anticoagulated.  Thurmon Fair, MD, [facility] [phone] 04/04/2021, 2:20 PM

## 2021-04-04 NOTE — ED Notes (Signed)
Discussed apnea alerts re: patient w/RN Josh when notification arose on monitor.

## 2021-04-04 NOTE — Telephone Encounter (Signed)
Returned the call to the daughter. The patient was taken to the hospital by EMS.   Message routed to Dr. Jacques Navy for her knowledge.

## 2021-04-04 NOTE — Progress Notes (Signed)
   04/04/21 1707  Assess: MEWS Score  Temp 98.5 F (36.9 C)  BP (!) 155/92  Pulse Rate (!) 133  ECG Heart Rate (!) 133  Resp 19  SpO2 99 %  O2 Device Room Air  Assess: MEWS Score  MEWS Temp 0  MEWS Systolic 0  MEWS Pulse 3  MEWS RR 0  MEWS LOC 0  MEWS Score 3  MEWS Score Color Yellow  Assess: if the MEWS score is Yellow or Red  Were vital signs taken at a resting state? Yes  Focused Assessment No change from prior assessment  Early Detection of Sepsis Score *See Row Information* Low  MEWS guidelines implemented *See Row Information* Yes  Treat  Pain Scale 0-10  Pain Score 0  Take Vital Signs  Increase Vital Sign Frequency  Yellow: Q 2hr X 2 then Q 4hr X 2, if remains yellow, continue Q 4hrs  Escalate  MEWS: Escalate Yellow: discuss with charge nurse/RN and consider discussing with provider and RRT  Notify: Charge Nurse/RN  Name of Charge Nurse/RN Notified Leary Roca, RN  Date Charge Nurse/RN Notified 04/04/21  Time Charge Nurse/RN Notified 1715  Document  Progress note created (see row info) Yes

## 2021-04-04 NOTE — ED Triage Notes (Signed)
Pt BIB [facility] for generalized weakness & dizzyness on exertion x2 days.  EMS reports a-flutter on strip. PT has hx of afib.  PT is on Eliquis recently increased from 2.5-5mg .    Pt has an appt scheduled with Heart [facility] Vascular today at 1:30 for follow up from a previous episode in February.

## 2021-04-04 NOTE — Telephone Encounter (Signed)
Patient's daughter called to say her patient is in the hospital for afib. Please advise

## 2021-04-04 NOTE — Consult Note (Addendum)
ELECTROPHYSIOLOGY CONSULT NOTE    Patient ID: Diana Lee MRN: [id number], DOB/AGE: Mar 11, 1929 87 y.o.  Admit date: 04/04/2021 Date of Consult: 04/04/2021  Primary Physician: [name], MD Primary Cardiologist: Parke Poisson, MD  Electrophysiologist: New to Dr. Ladona Ridgel  Referring Provider: Dr. Royann Shivers  Patient Profile: Diana Lee is a 87 y.o. female with a history of atrial flutter, hypothyroidism, and depression  who is being seen today for the evaluation of atrial flutter at the request of Dr. Royann Shivers.  HPI:  Diana Lee is a 87 y.o. female with medical history as above.  AFL diagnosed during admission 12/2020. EF at that time showed LVEF 60-65%. She had orthostasis and weakness while in flutter. Her TSH was elevated at 10.459 and planned for medications and outpatient [facility].  [facility] 12/2020 complicated by hypotension and bradycardia requiring phenylephrine and ephedrine.   Ultimately lopressor was stopped as outpatient for unclear reasons. She did not tolerate diltiazem with bradycardia and hypotension previously.   Eliquis has also been adjusted with labile weight around 60kg mark.   She presents today with dizziness and weakness. She states she was going out to the mailbox "Monday" and felt like her out was out of rhythm again based on those symptoms. Per son she has been out in the garden at times weather permitting, and in general gets around and does her ADLs without difficulty.    Pertinent labs on admission include K 3.8, Cr 0.71, Hgb 14.7, WBC 7.3, COVID and Flu NEGATIVE.  Past Medical History:  Diagnosis Date  . Atrial flutter (HCC)   . Depression   . Thyroid disease      Surgical History:  Past Surgical History:  Procedure Laterality Date  . CARDIOVERSION N/A 01/14/2021   Procedure: CARDIOVERSION;  Surgeon: Parke Poisson, MD;  Location: [facility] ENDOSCOPY;  Service: Cardiovascular;  Laterality: N/A;     (Not in a hospital admission)   Inpatient  Medications:  . apixaban  5 mg Oral BID    Allergies: No Known Allergies  Social History   Socioeconomic History  . Marital status: Married    Spouse name: Not on file  . Number of children: Not on file  . Years of education: Not on file  . Highest education level: Not on file  Occupational History  . Not on file  Tobacco Use  . Smoking status: Never Smoker  . Smokeless tobacco: Never Used  Vaping Use  . Vaping Use: Never used  Substance and Sexual Activity  . Alcohol use: Never  . Drug use: Never  . Sexual activity: Not Currently  Other Topics Concern  . Not on file  Social History Narrative  . Not on file   Social Determinants of Health   Financial Resource Strain: Not on file  Food Insecurity: Not on file  Transportation Needs: Not on file  Physical Activity: Not on file  Stress: Not on file  Social Connections: Not on file  Intimate Partner Violence: Not on file     Family History  Problem Relation Age of Onset  . Stomach cancer Mother      Review of Systems: All other systems reviewed and are otherwise negative except as noted above.  Physical Exam: Vitals:   04/04/21 1230 04/04/21 1245 04/04/21 1300 04/04/21 1330  BP: (!) 142/92 121/83 134/86 (!) 148/97  Pulse: (!) 133 (!) 132 (!) 132 (!) 131  Resp: 13 13 13 19   Temp:      TempSrc:  SpO2: 98% 98% 97% 99%  Weight:      Height:        GEN- The patient is elderly appearing, alert and oriented x 3 today.   HEENT: normocephalic, atraumatic; sclera clear, conjunctiva pink; hearing intact; oropharynx clear; neck supple Lungs- Clear to ausculation bilaterally, normal work of breathing.  No wheezes, rales, rhonchi Heart- Regular and rapid rate and rhythm, no murmurs, rubs or gallops GI- soft, non-tender, non-distended, bowel sounds present Extremities- no clubbing, cyanosis, or edema; DP/PT/radial pulses 2+ bilaterally MS- no significant deformity or atrophy Skin- warm and dry, no rash or  lesion Psych- euthymic mood, full affect Neuro- strength and sensation are intact  Labs:   Lab Results  Component Value Date   WBC 7.3 04/04/2021   HGB 14.7 04/04/2021   HCT 46.2 (H) 04/04/2021   MCV 103.4 (H) 04/04/2021   PLT 165 04/04/2021    Recent Labs  Lab 04/04/21 1207  NA 141  K 3.8  CL 111  CO2 21*  BUN 17  CREATININE 0.71  CALCIUM 8.4*  GLUCOSE 92      Radiology/Studies: DG Chest Port 1 View  Result Date: 04/04/2021 CLINICAL DATA:  Atrial flutter EXAM: PORTABLE CHEST 1 VIEW COMPARISON:  12/20/2020 FINDINGS: The heart size and mediastinal contours are within normal limits. Both lungs are clear. The visualized skeletal structures are unremarkable. IMPRESSION: No active disease. Electronically Signed   By: Marlan Palau M.D.   On: 04/04/2021 12:57    EKG:2:1 Atrial flutter in 130s (personally reviewed)  TELEMETRY: 2:1 atrial flutter in 130s (personally reviewed)  Assessment/Plan: 1.  Atrial flutter with RVR Normal EF by Echo 12/2020 Underwent [facility] 01/14/2021 complicated by hypotension and bradycardia.  Previously stopped lopressor. Did not tolerate diltiazem with hypotension and bradycardia. Eliquis dosage INCREASED on 03/26/21 given weight being above 60 kg since most recent checks.  Will avoid IV amiodarone with h/o bradycardia after cardioversion. Will plan for atrial flutter ablation on Monday.with Dr. Ladona Ridgel.  CHA2DS2VASC of 3 given age and sex. She may be able to come off of OAC if remains in NSR after ablation  For questions or updates, please contact [facility] Please consult [email or web address] for contact info under Cardiology/STEMI.  Dustin Flock, PA-C  04/04/2021 3:43 PM  EP Attending Patient seen and examined. Agree with the findings as noted above. The patient presents with symptomatic typical atrial flutter. She had this previously and got quite sick with DCCV and has poorly tolerated medical therapy with hypotension and then  sinus brady on drugs that block the sinus node. Her exam reveals a well appearing elderly woman in no distress. He HR is 135 and regular and lungs reveal minimal scattered rales. Neuro is normal and there is no edema. Her belly is soft. CV reveals a Regular tachy. ECG reveals typical atrial flutter. I discussed the treatment options with the patient and her son in detail. The risks/benefits/goals/expectations of EP study and catheter ablation were reviewed and she wishes to proceed. We will plan for this on Monday.  Sharlot Gowda Jahmire Ruffins,MD

## 2021-04-04 NOTE — ED Provider Notes (Signed)
[facility] EMERGENCY DEPARTMENT Provider Note   CSN: [id number] Arrival date & time: 04/04/21  1132     History Chief Complaint  Patient presents with  . Weakness    Diana Lee is a 87 y.o. female.  HPI  87 year old female history of a flutter on Eliquis presents today with lightheadedness and palpitations.  Patient had cardioversion in March.  She reports doing pretty well since that time.  Over the past 2 days she has been lightheaded and had palpitations.  She is not having associated pain.  She has been taking her medications as prescribed.  Appear to be on rate control medications that she had some problems with bradycardia and hypotension previously on these medications.  She is taking her Eliquis and took her last dose last night.     Past Medical History:  Diagnosis Date  . Depression   . Thyroid disease     Patient Active Problem List   Diagnosis Date Noted  . Near syncope   . Atrial flutter (HCC) 12/20/2020  . Hypothyroidism 12/20/2020    Past Surgical History:  Procedure Laterality Date  . CARDIOVERSION N/A 01/14/2021   Procedure: CARDIOVERSION;  Surgeon: Parke Poisson, MD;  Location: [facility] ENDOSCOPY;  Service: Cardiovascular;  Laterality: N/A;     OB History   No obstetric history on file.     History reviewed. No pertinent family history.  Social History   Tobacco Use  . Smoking status: Never Smoker  . Smokeless tobacco: Never Used  Vaping Use  . Vaping Use: Never used  Substance Use Topics  . Alcohol use: Never  . Drug use: Never    Home Medications Prior to Admission medications   Medication Sig Start Date End Date Taking? Authorizing Provider  acetaminophen (TYLENOL) 500 MG tablet Take 500 mg by mouth every 6 (six) hours as needed for mild pain.    [provider]  apixaban (ELIQUIS) 5 MG TABS tablet Take 1 tablet (5 mg total) by mouth 2 (two) times daily. 03/26/21   Parke Poisson, MD  Calcium Carbonate  (CALTRATE 600 PO) Take 1 tablet by mouth daily.    [provider]  cholecalciferol (VITAMIN D3) 25 MCG (1000 UNIT) tablet Take 1,000 Units by mouth daily.    [provider]  levothyroxine (SYNTHROID) 50 MCG tablet Take 50 mcg by mouth daily before breakfast. 12/24/20   [provider]  sertraline (ZOLOFT) 50 MG tablet Take 50 mg by mouth daily. 12/13/20   [provider]    Allergies    Patient has no allergy information on record.  Review of Systems   Review of Systems  All other systems reviewed and are negative.   Physical Exam Updated Vital Signs BP 121/83   Pulse (!) 132   Temp 99.3 F (37.4 C) (Oral)   Resp 13   Ht 1.651 m (5\' 5" )   Wt 62.1 kg   SpO2 98%   BMI 22.80 kg/m   Physical Exam Vitals and nursing note reviewed.  Constitutional:      Appearance: She is well-developed.  HENT:     Head: Normocephalic and atraumatic.     Right Ear: External ear normal.     Left Ear: External ear normal.     Nose: Nose normal.  Eyes:     Conjunctiva/sclera: Conjunctivae normal.     Pupils: Pupils are equal, round, and reactive to light.  Neck:     Thyroid: No thyromegaly.  Vascular: No JVD.     Trachea: No tracheal deviation.  Cardiovascular:     Rate and Rhythm: Regular rhythm. Tachycardia present.     Heart sounds: Normal heart sounds.  Pulmonary:     Effort: Pulmonary effort is normal.     Breath sounds: Normal breath sounds. No wheezing.  Abdominal:     General: Bowel sounds are normal.     Palpations: Abdomen is soft. There is no mass.     Tenderness: There is no abdominal tenderness. There is no guarding.  Musculoskeletal:        General: Normal range of motion.     Cervical back: Normal range of motion and neck supple.  Lymphadenopathy:     Cervical: No cervical adenopathy.  Skin:    General: Skin is warm and dry.  Neurological:     Mental Status: She is alert and oriented to person, place, and time.     GCS: GCS eye  subscore is 4. GCS verbal subscore is 5. GCS motor subscore is 6.     Cranial Nerves: No cranial nerve deficit.     Sensory: No sensory deficit.     Gait: Gait normal.     Deep Tendon Reflexes: Reflexes are normal and symmetric. Babinski sign absent on the right side. Babinski sign absent on the left side.     Reflex Scores:      Bicep reflexes are 2+ on the right side and 2+ on the left side.      Patellar reflexes are 2+ on the right side and 2+ on the left side.    Comments: Strength is normal and equal throughout. Cranial nerves grossly intact. Patient fluent. No gross ataxia and patient able to ambulate without difficulty.  Psychiatric:        Behavior: Behavior normal.        Thought Content: Thought content normal.        Judgment: Judgment normal.     ED Results / Procedures / Treatments   Labs (all labs ordered are listed, but only abnormal results are displayed) Labs Reviewed  RESP PANEL BY RT-PCR (FLU A&B, COVID) [id number]  CBC  BASIC METABOLIC PANEL    EKG EKG Interpretation  Date/Time:  Friday April 04 2021 11:45:58 EDT Ventricular Rate:  134 PR Interval:  135 QRS Duration: 70 QT Interval:  274 QTC Calculation: 411 R Axis:   4 Text Interpretation: Atrial flutter with 2 to 1 block Paired ventricular premature complexes Aberrant conduction of SV complex(es) Probable anteroseptal infarct, recent Abnormal T, consider ischemia, diffuse leads Lateral leads are also involved Confirmed by Margarita Grizzle [phone]) on 04/04/2021 1:00:46 PM   Radiology DG Chest Port 1 View  Result Date: 04/04/2021 CLINICAL DATA:  Atrial flutter EXAM: PORTABLE CHEST 1 VIEW COMPARISON:  12/20/2020 FINDINGS: The heart size and mediastinal contours are within normal limits. Both lungs are clear. The visualized skeletal structures are unremarkable. IMPRESSION: No active disease. Electronically Signed   By: Marlan Palau M.D.   On: 04/04/2021 12:57    Procedures .Critical Care Performed by: Margarita Grizzle, MD Authorized by: Margarita Grizzle, MD   Critical care provider statement:    Critical care time (minutes):  45   Critical care was necessary to treat or prevent imminent or life-threatening deterioration of the following conditions:  Cardiac failure   Critical care was time spent personally by me on the following activities:  Discussions with consultants, evaluation of patient's response to treatment, examination of patient, ordering  and performing treatments and interventions, ordering and review of laboratory studies, ordering and review of radiographic studies, pulse oximetry, re-evaluation of patient's condition, obtaining history from patient or surrogate and review of old charts     Medications Ordered in ED Medications - No data to display  ED Course  I have reviewed the triage vital signs and the nursing notes.  Pertinent labs & imaging results that were available during my care of the patient were reviewed by me and considered in my medical decision making (see chart for details).    MDM Rules/Calculators/A&P                         Patient with recent history of atrial flutter now on Eliquis.  Previously cardioverted by cardiology.  She presents today with atrial flutter with a rate of about 130.  Patient's blood pressure is normal, she is not dyspneic, sats are 98%, labs are currently pending.  Care discussed with cardiology and they will see and evaluate Final Clinical Impression(s) / ED Diagnoses Final diagnoses:  Typical atrial flutter [facility])    Rx / DC Orders ED Discharge Orders    None       Margarita Grizzle, MD 04/04/21 1528

## 2021-04-05 DIAGNOSIS — I4892 Unspecified atrial flutter: Secondary | ICD-10-CM

## 2021-04-05 LAB — BASIC METABOLIC PANEL
Anion gap: 7 (ref 5–15)
BUN: 20 mg/dL (ref 8–23)
CO2: 28 mmol/L (ref 22–32)
Calcium: 9.4 mg/dL (ref 8.9–10.3)
Chloride: 104 mmol/L (ref 98–111)
Creatinine, Ser: 1.15 mg/dL — ABNORMAL HIGH (ref 0.44–1.00)
GFR, Estimated: 45 mL/min — ABNORMAL LOW (ref >60)
Glucose, Bld: 93 mg/dL (ref 70–99)
Potassium: 4.6 mmol/L (ref 3.5–5.1)
Sodium: 139 mmol/L (ref 135–145)

## 2021-04-05 MED ORDER — ALUM & MAG HYDROXIDE-SIMETH 200-200-20 MG/5ML PO SUSP
30.0000 mL | ORAL | Status: DC | PRN
  Administered 2021-04-05: 30 mL via ORAL
  Filled 2021-04-05: qty 30

## 2021-04-05 NOTE — Progress Notes (Signed)
Notified by CCMD pt HR as high as 180. HR now sustaining in 140s. Jaynie Bream PA notified.

## 2021-04-06 MED ORDER — OFF THE BEAT BOOK
Freq: Once | Status: DC
  Filled 2021-04-06: qty 1

## 2021-04-06 NOTE — Progress Notes (Signed)
Progress Note  Patient Name: Diana Lee Date of Encounter: 04/06/2021  Primary Cardiologist: Parke Poisson, MD   Subjective   No chest pain or sob. "My daughter wanted to talk to you."  Inpatient Medications    Scheduled Meds: . apixaban  5 mg Oral BID  . levothyroxine  50 mcg Oral Q0600  . sertraline  50 mg Oral Daily   Continuous Infusions:  PRN Meds: acetaminophen, alum & mag hydroxide-simeth, ondansetron (ZOFRAN) IV   Vital Signs    Vitals:   04/05/21 0820 04/05/21 2021 04/06/21 0021 04/06/21 0351  BP: (!) 144/88 122/78 108/64 (!) 106/54  Pulse: (!) 103  79 92  Resp: 14 20 15 15   Temp: 98 F (36.7 C) 98 F (36.7 C) 98 F (36.7 C) 98 F (36.7 C)  TempSrc: Oral Oral Oral Oral  SpO2: 98% 96% 97% 96%  Weight:    60.1 kg  Height:       No intake or output data in the 24 hours ending 04/06/21 0825 Filed Weights   04/04/21 1202 04/05/21 0509 04/06/21 0351  Weight: 62.1 kg 60.3 kg 60.1 kg    Telemetry    Atrial flutter with a RVR - Personally Reviewed  ECG    none - Personally Reviewed  Physical Exam   GEN: No acute distress.   Neck: No JVD Cardiac: Reg tachy, no murmurs, rubs, or gallops.  Respiratory: Clear to auscultation bilaterally. GI: Soft, nontender, non-distended  MS: No edema; No deformity. Neuro:  Nonfocal  Psych: Normal affect   Labs    Chemistry Recent Labs  Lab 04/04/21 1207 04/05/21 0235  NA 141 139  K 3.8 4.6  CL 111 104  CO2 21* 28  GLUCOSE 92 93  BUN 17 20  CREATININE 0.71 1.15*  CALCIUM 8.4* 9.4  GFRNONAA >60 45*  ANIONGAP 9 7     Hematology Recent Labs  Lab 04/04/21 1207  WBC 7.3  RBC 4.47  HGB 14.7  HCT 46.2*  MCV 103.4*  MCH 32.9  MCHC 31.8  RDW 12.3  PLT 165    Cardiac EnzymesNo results for input(s): TROPONINI in the last 168 hours. No results for input(s): TROPIPOC in the last 168 hours.   BNPNo results for input(s): BNP, PROBNP in the last 168 hours.   DDimer No results for input(s):  DDIMER in the last 168 hours.   Radiology    DG Chest Port 1 View  Result Date: 04/04/2021 CLINICAL DATA:  Atrial flutter EXAM: PORTABLE CHEST 1 VIEW COMPARISON:  12/20/2020 FINDINGS: The heart size and mediastinal contours are within normal limits. Both lungs are clear. The visualized skeletal structures are unremarkable. IMPRESSION: No active disease. Electronically Signed   By: 12/22/2020 M.D.   On: 04/04/2021 12:57    Cardiac Studies   none  Patient Profile     87 y.o. female admitted with symptomatic atrial flutter  Assessment & Plan    1. Atrial flutter with a RVR - her HR remains around 140/min. I am reluctant to add either a beta blocker or a calcium channel blocker as she has had marked hypotension previously. She will undergo catheter ablation tomorrow. I have reviewed the indications/risks/benefits/goals/expectations with the patient and she wishes to proceed. 2. Sob - her dyspnea is class 3B. She can get out of bed to the bathroom but not much else. This should get better once she is back in NSR.  3. Coags - she has not missed any eliquis. With  her advanced age, she is close to needing a lower dose of eliquis.   [name]  For questions or updates, please contact [facility] Please consult [email or web address] for contact info under Cardiology/STEMI.      Signed, Lewayne Bunting, MD  04/06/2021, 8:25 AM  Patient ID: Diana Lee, female   DOB: 12-Jul-1929, 87 y.o.   MRN: [id number]

## 2021-04-06 NOTE — Plan of Care (Signed)
°  Problem: Education: °Goal: Knowledge of disease or condition will improve °Outcome: Progressing °Goal: Understanding of medication regimen will improve °Outcome: Progressing °  °

## 2021-04-07 ENCOUNTER — Encounter (HOSPITAL_COMMUNITY): Payer: Self-pay | Admitting: Internal Medicine

## 2021-04-07 ENCOUNTER — Encounter (HOSPITAL_COMMUNITY)
Admission: EM | Disposition: A | Discharge status: Home / Self Care | Payer: Self-pay | Source: Home / Self Care | Attending: Internal Medicine

## 2021-04-07 HISTORY — PX: A-FLUTTER ABLATION: EP1230

## 2021-04-07 SURGERY — A-FLUTTER ABLATION

## 2021-04-07 MED ORDER — SODIUM CHLORIDE 0.9 % IV SOLN: INTRAVENOUS | Status: DC

## 2021-04-07 MED ORDER — HEPARIN (PORCINE) IN NACL 1000-0.9 UT/500ML-% IV SOLN
INTRAVENOUS | Status: DC | PRN
  Administered 2021-04-07: 500 mL

## 2021-04-07 MED ORDER — SODIUM CHLORIDE 0.9 % IV SOLN: 250.0000 mL | INTRAVENOUS | Status: DC | PRN

## 2021-04-07 MED ORDER — BUPIVACAINE HCL (PF) 0.25 % IJ SOLN
INTRAMUSCULAR | Status: DC | PRN
  Administered 2021-04-07: 20 mL

## 2021-04-07 MED ORDER — BUPIVACAINE HCL (PF) 0.25 % IJ SOLN
INTRAMUSCULAR | Status: AC
  Filled 2021-04-07: qty 30

## 2021-04-07 MED ORDER — ACETAMINOPHEN ER 650 MG PO TBCR: 650.0000 mg | EXTENDED_RELEASE_TABLET | Freq: Two times a day (BID) | ORAL | Status: DC | PRN

## 2021-04-07 MED ORDER — ACETAMINOPHEN 325 MG PO TABS: 650.0000 mg | ORAL_TABLET | ORAL | Status: DC | PRN

## 2021-04-07 MED ORDER — MIDAZOLAM HCL 5 MG/5ML IJ SOLN
INTRAMUSCULAR | Status: AC
  Filled 2021-04-07: qty 5

## 2021-04-07 MED ORDER — SODIUM CHLORIDE 0.9% FLUSH
3.0000 mL | Freq: Two times a day (BID) | INTRAVENOUS | Status: DC
  Administered 2021-04-08: 3 mL via INTRAVENOUS

## 2021-04-07 MED ORDER — HEPARIN (PORCINE) IN NACL 1000-0.9 UT/500ML-% IV SOLN
INTRAVENOUS | Status: AC
  Filled 2021-04-07: qty 500

## 2021-04-07 MED ORDER — ONDANSETRON HCL 4 MG/2ML IJ SOLN: 4.0000 mg | Freq: Four times a day (QID) | INTRAMUSCULAR | Status: DC | PRN

## 2021-04-07 MED ORDER — SODIUM CHLORIDE 0.9% FLUSH: 3.0000 mL | INTRAVENOUS | Status: DC | PRN

## 2021-04-07 MED ORDER — FENTANYL CITRATE (PF) 100 MCG/2ML IJ SOLN
INTRAMUSCULAR | Status: AC
  Filled 2021-04-07: qty 2

## 2021-04-07 MED ORDER — MIDAZOLAM HCL 5 MG/5ML IJ SOLN
INTRAMUSCULAR | Status: DC | PRN
  Administered 2021-04-07 (×5): 1 mg via INTRAVENOUS

## 2021-04-07 MED ORDER — FENTANYL CITRATE (PF) 100 MCG/2ML IJ SOLN
INTRAMUSCULAR | Status: DC | PRN
  Administered 2021-04-07 (×5): 12.5 ug via INTRAVENOUS

## 2021-04-07 SURGICAL SUPPLY — 11 items
BAG SNAP BAND KOVER 36X36 (MISCELLANEOUS) ×2 IMPLANT
CATH BLAZERPRIME XP (ABLATOR) ×2 IMPLANT
CATH DUODECA HALO/ISMUS 7FR (CATHETERS) ×2 IMPLANT
CATH JOSEPH QUAD ALLRED 6F REP (CATHETERS) ×2 IMPLANT
CATH POLARIS X 2.5/5/2.5 DECAP (CATHETERS) ×2 IMPLANT
PACK EP LATEX FREE (CUSTOM PROCEDURE TRAY) ×2
PACK EP LF (CUSTOM PROCEDURE TRAY) ×1 IMPLANT
PAD PRO RADIOLUCENT 2001M-C (PAD) ×2 IMPLANT
SHEATH PINNACLE 6F 10CM (SHEATH) ×4 IMPLANT
SHEATH PINNACLE 7F 10CM (SHEATH) ×2 IMPLANT
SHEATH PINNACLE 8F 10CM (SHEATH) ×2 IMPLANT

## 2021-04-07 NOTE — Progress Notes (Signed)
Site area: Right groin a 7, and 8 french X2 venous sheaths were removed  Site Prior to Removal:  Level 0  Pressure Applied For 20 MINUTES    Bedrest Beginning at 1830pm   Manual:   Yes.    Patient Status During Pull:  stable  Post Pull Groin Site:  Level 0  Post Pull Instructions Given:  Yes.    Post Pull Pulses Present:  Yes.    Dressing Applied:  Yes.    Comments:

## 2021-04-07 NOTE — Progress Notes (Addendum)
Progress Note  Patient Name: Diana Lee Date of Encounter: 04/07/2021  Primary Cardiologist: Parke Poisson, MD   Subjective   No CP, some mild palpitations, seated without SOB or lightheadedness.  Inpatient Medications    Scheduled Meds: . apixaban  5 mg Oral BID  . levothyroxine  50 mcg Oral Q0600  . off the beat book   Does not apply Once  . sertraline  50 mg Oral Daily   Continuous Infusions: . sodium chloride     PRN Meds: acetaminophen, alum & mag hydroxide-simeth, ondansetron (ZOFRAN) IV   Vital Signs    Vitals:   04/06/21 1522 04/06/21 2108 04/07/21 0112 04/07/21 0608  BP: 116/83 119/63 108/63 122/67  Pulse: 97     Resp: 16 18 16 17   Temp: 98.5 F (36.9 C) (!) 97.3 F (36.3 C) 97.6 F (36.4 C) 97.9 F (36.6 C)  TempSrc: Oral Oral Oral Oral  SpO2:  99% 100% 99%  Weight:      Height:        Intake/Output Summary (Last 24 hours) at 04/07/2021 0744 Last data filed at 04/06/2021 2107 Gross per 24 hour  Intake 360 ml  Output --  Net 360 ml   Filed Weights   04/04/21 1202 04/05/21 0509 04/06/21 0351  Weight: 62.1 kg 60.3 kg 60.1 kg    Telemetry    Atrial flutter with a RVR 110's-140s - Personally Reviewed  ECG    none - Personally Reviewed  Physical Exam   GEN: No acute distress.   Neck: No JVD Cardiac: RRR/tachycardic, no murmurs, rubs, or gallops.  Respiratory: CTA b/l. GI: Soft, nontender, non-distended  MS: No edema; No deformity. Neuro:  Nonfocal  Psych: Normal affect   Labs    Chemistry Recent Labs  Lab 04/04/21 1207 04/05/21 0235  NA 141 139  K 3.8 4.6  CL 111 104  CO2 21* 28  GLUCOSE 92 93  BUN 17 20  CREATININE 0.71 1.15*  CALCIUM 8.4* 9.4  GFRNONAA >60 45*  ANIONGAP 9 7     Hematology Recent Labs  Lab 04/04/21 1207  WBC 7.3  RBC 4.47  HGB 14.7  HCT 46.2*  MCV 103.4*  MCH 32.9  MCHC 31.8  RDW 12.3  PLT 165    Cardiac EnzymesNo results for input(s): TROPONINI in the last 168 hours. No results for  input(s): TROPIPOC in the last 168 hours.   BNPNo results for input(s): BNP, PROBNP in the last 168 hours.   DDimer No results for input(s): DDIMER in the last 168 hours.   Radiology    No results found.  Cardiac Studies   12/21/2020: TTE IMPRESSIONS  1. Left ventricular ejection fraction, by estimation, is 60 to 65%. The  left ventricle has normal function. The left ventricle has no regional  wall motion abnormalities. Left ventricular diastolic function could not  be evaluated.  2. Right ventricular systolic function is normal. The right ventricular  size is normal. There is normal pulmonary artery systolic pressure. The  estimated right ventricular systolic pressure is 28.0 mmHg.  3. The mitral valve is abnormal. Trivial mitral valve regurgitation.  4. The aortic valve is tricuspid. Aortic valve regurgitation is not  visualized.  5. The inferior vena cava is normal in size with greater than 50%  respiratory variability, suggesting right atrial pressure of 3 mmHg.   Comparison(s): No prior Echocardiogram.   Patient Profile     87 y.o. female admitted with symptomatic atrial flutter  PMHx  hypothyroidism, depression  Assessment & Plan    1. Atrial flutter with a RVR  HRs interm,ittently are better 110's, this AM up again Planned for EPS/ablation tdoay No plans for either a beta blocker or a calcium channel blocker as she has had marked hypotension and some bradycardia previously.   On Eliquis, dose adjusted 03/26/21 to 5mg  BID for weight Symptoms of her tachycardia/AFlutter developed day prior to admission, notes report no missed doses at home, and getting here  . 2. Sob - she denies any SOB to me at rest or with exertion    When in AFlutter/RVR she gets very weak and lightheaded with ambulation    Seated this AM she is minimally symptomatic with palpitations, though feels well otherwise   For questions or updates, please contact [facility] Please consult  [email or web address] for contact info under Cardiology/STEMI.      Signed, , PA-C  04/07/2021, 7:44 AM  Patient ID: 06/07/2021, female   DOB: [birth year], 87 y.o.   MRN: [id number]  EP Attending  Patient seen and examined. Agree with the findings as noted above. I have discussed the indications/risks/benefits/goals/expectations and she wishes to proceed.  [phone] Diana Fuhriman,MD

## 2021-04-07 NOTE — Discharge Instructions (Signed)
Post procedure care instructions No driving for 4 days. No lifting over 5 lbs for 1 week. No vigorous or sexual activity for 1 week. You may return to work/your usual activities on 04/15/21. Keep procedure site clean & dry. If you notice increased pain, swelling, bleeding or pus, call/return!  You may shower after 24 hours, but no soaking in baths/hot tubs/pools for 1 week.

## 2021-04-08 NOTE — Care Management Important Message (Signed)
Important Message  Patient Details  Name: Diana Lee MRN: [id number] Date of Birth: Sep 25, 1929   Medicare Important Message Given:  Yes     Renie Ora 04/08/2021, 8:32 AM

## 2021-04-08 NOTE — Discharge Summary (Addendum)
DISCHARGE SUMMARY    Patient ID: Diana Lee,  MRN: [id number], DOB/AGE: Sep 23, 1929 87 y.o.  Admit date: 04/04/2021 Discharge date: 04/08/2021  Primary Care Physician: Diana Raider, MD  Primary Cardiologist: Diana Lee Electrophysiologist: Diana Lee  Primary Discharge Diagnosis:  1. Atrial flutter status post ablation this admission  Secondary Discharge Diagnosis:  1. Hypothyroidism 2. depression  No Known Allergies   Procedures This Admission: 1.  Electrophysiology study and radiofrequency catheter ablation on 04/07/21 by Dr Diana Lee.   This study demonstrated   CONCLUSIONS:  1. Isthmus-dependent right atrial flutter upon presentation.  2. Successful radiofrequency ablation of atrial flutter along the cavotricuspid isthmus with complete bidirectional isthmus block achieved.  3. No inducible arrhythmias following ablation.  4. No early apparent complications.    Brief HPI: Diana Lee is a 87 y.o. female with a past medical history as outlined above.  Developed recurrent symptoms of her AFlutter, RVR and sought medical attention.  When in AFlutter she feels particularly weak, and with minimal exertion like she is going to "give out"She was found in AFlutter 2:1 conduction.  GIven hx of bradycardia post DCCV not started on rate limiting meds, she was admitted and EP consulted.  Hospital Course:  The patient was admitted no CP, no SOB.  Labs largely unremarkable TSH was wnl.  She was monitored on telemetry and at rest minimally symptomatic with preserved BP.  She underwent EPS/RFCA with details as outlined above. She was monitored on telemetry overnight which demonstrated SR 70's, rare brief PATs.  Groin waswithout complication.  TShe was examined by Diana Lee who considered the patient stable for discharge to home.  Follow up is arranged in 4 weeks.  Wound care and restrictions were reviewed with the patient prior to discharge.   Given her weight and Creat, Eliquis is  appropriately dosed, though going forward in d/w Diana Lee, he would have low threshold in 90/F to reduce the dose if any concerns of bleeding  Physical Exam: Vitals:   04/08/21 0330 04/08/21 0414 04/08/21 0527 04/08/21 0750  BP: (!) 122/52 (!) 109/49 (!) 132/54 (!) 125/55  Pulse: 71 71 74 68  Resp:  15  16  Temp:  98.5 F (36.9 C)  99.4 F (37.4 C)  TempSrc:  Oral  Oral  SpO2: 98% 100% 100% 97%  Weight:      Height:        GEN- The patient is well appearing, alert and oriented x 3 today.   HEENT: normocephalic, atraumatic; sclera clear, conjunctiva pink; hearing intact; oropharynx clear; neck supple, no JVP Lymph- no cervical lymphadenopathy Lungs- CTA b/l, normal work of breathing.  No wheezes, rales, rhonchi Heart- RRR, no murmurs, rubs or gallops, PMI not laterally displaced GI- soft, non-tender, non-distended Extremities- no clubbing, cyanosis, or edema; R groin without hematoma/bruit MS- no significant deformity or atrophy Skin- warm and dry, no rash or lesion Psych- euthymic mood, full affect Neuro- strength and sensation are intact   Labs:   Lab Results  Component Value Date   WBC 7.3 04/04/2021   HGB 14.7 04/04/2021   HCT 46.2 (H) 04/04/2021   MCV 103.4 (H) 04/04/2021   PLT 165 04/04/2021    Recent Labs  Lab 04/05/21 0235  NA 139  K 4.6  CL 104  CO2 28  BUN 20  CREATININE 1.15*  CALCIUM 9.4  GLUCOSE 93    Discharge Medications:  Allergies as of 04/08/2021   No Known Allergies  Medication List     TAKE these medications    acetaminophen 500 MG tablet Commonly known as: TYLENOL Take 500 mg by mouth every 6 (six) hours as needed for mild pain or headache.   acetaminophen 650 MG CR tablet Commonly known as: TYLENOL Take 650 mg by mouth every 8 (eight) hours as needed for pain.   apixaban 5 MG Tabs tablet Commonly known as: ELIQUIS Take 1 tablet (5 mg total) by mouth 2 (two) times daily.   CALTRATE 600 PO Take 1 tablet by mouth  daily.   cholecalciferol 25 MCG (1000 UNIT) tablet Commonly known as: VITAMIN D3 Take 1,000 Units by mouth daily.   levothyroxine 50 MCG tablet Commonly known as: SYNTHROID Take 50 mcg by mouth daily before breakfast.   sertraline 50 MG tablet Commonly known as: ZOLOFT Take 50 mg by mouth daily.        Disposition: Home Discharge Instructions     Diet - low sodium heart healthy   Complete by: As directed    Increase activity slowly   Complete by: As directed        Follow-up Information     Marinus Maw, MD Follow up.   Specialty: Cardiology Why: 05/20/21 @ 12:00Pm (noon) Contact information: [address] [place] [address] [phone]                 Duration of Discharge Encounter: Greater than 30 minutes including physician time.  Norma Fredrickson, [place] 04/08/2021 9:42 AM  EP Attending  Patient seen and examined. Agree with the findings as noted above. The patient is doing well after undergoing EPS/RFA of atrial flutter. She is maintaining NSR and her exam demonstrates no CHF symptoms. Her right groin is soft with no hematoma. She will be discharged home with the usual followup.   Sharlot Gowda Kala Gassmann,MD

## 2021-05-13 DIAGNOSIS — Z961 Presence of intraocular lens: Secondary | ICD-10-CM | POA: Diagnosis not present

## 2021-05-13 DIAGNOSIS — H52203 Unspecified astigmatism, bilateral: Secondary | ICD-10-CM | POA: Diagnosis not present

## 2021-05-13 DIAGNOSIS — H524 Presbyopia: Secondary | ICD-10-CM | POA: Diagnosis not present

## 2021-05-20 ENCOUNTER — Other Ambulatory Visit: Payer: Self-pay

## 2021-05-20 ENCOUNTER — Ambulatory Visit: Payer: Medicare Other | Admitting: Internal Medicine

## 2021-05-20 ENCOUNTER — Encounter: Payer: Self-pay | Admitting: Internal Medicine

## 2021-05-20 VITALS — BP 154/80 | HR 79 | Ht 64.0 in | Wt 136.2 lb

## 2021-05-20 DIAGNOSIS — I483 Typical atrial flutter: Secondary | ICD-10-CM

## 2021-05-20 NOTE — Patient Instructions (Addendum)
Medication Instructions:  °Your physician recommends that you continue on your current medications as directed. Please refer to the Current Medication list given to you today. ° °Labwork: °None ordered. ° °Testing/Procedures: °None ordered. ° °Follow-Up: °Your physician wants you to follow-up in: 6 months with Gregg Taylor, MD  °You will receive a reminder letter in the mail two months in advance. If you don't receive a letter, please call our office to schedule the follow-up appointment. ° ° °Any Other Special Instructions Will Be Listed Below (If Applicable). ° °If you need a refill on your cardiac medications before your next appointment, please call your pharmacy.  ° ° ° ° °

## 2021-05-20 NOTE — Progress Notes (Signed)
HPI [name] returns today for followup. She is a very pleasant and highly functioning 87 yo woman with symptomatic atrial flutter who underwent EP study and catheter ablation of atrial flutter several weeks ago. She has done well in the interim with no symptoms. She is able to work outside but still gets sob with exertion. She is gardening despite her dyspnea and the heat. She denies chest pain or syncope.  No Known Allergies   Current Outpatient Medications  Medication Sig Dispense Refill   acetaminophen (TYLENOL) 500 MG tablet Take 500 mg by mouth every 6 (six) hours as needed for mild pain or headache.     acetaminophen (TYLENOL) 650 MG CR tablet Take 650 mg by mouth every 8 (eight) hours as needed for pain.     apixaban (ELIQUIS) 5 MG TABS tablet Take 1 tablet (5 mg total) by mouth 2 (two) times daily. 60 tablet 5   Calcium Carbonate (CALTRATE 600 PO) Take 1 tablet by mouth daily.     cholecalciferol (VITAMIN D3) 25 MCG (1000 UNIT) tablet Take 1,000 Units by mouth daily.     levothyroxine (SYNTHROID) 50 MCG tablet Take 50 mcg by mouth daily before breakfast.     sertraline (ZOLOFT) 50 MG tablet Take 50 mg by mouth daily.     No current facility-administered medications for this visit.     Past Medical History:  Diagnosis Date   Atrial flutter (HCC)    Depression    Thyroid disease     ROS:   All systems reviewed and negative except as noted in the HPI.   Past Surgical History:  Procedure Laterality Date   A-FLUTTER ABLATION N/A 04/07/2021   Procedure: A-FLUTTER ABLATION;  Surgeon: Marinus Maw, MD;  Location: [facility] INVASIVE CV LAB;  Service: Cardiovascular;  Laterality: N/A;   CARDIOVERSION N/A 01/14/2021   Procedure: CARDIOVERSION;  Surgeon: Parke Poisson, MD;  Location: [facility] ENDOSCOPY;  Service: Cardiovascular;  Laterality: N/A;     Family History  Problem Relation Age of Onset   Stomach cancer Mother      Social History   Socioeconomic History    Marital status: Married    Spouse name: Not on file   Number of children: Not on file   Years of education: Not on file   Highest education level: Not on file  Occupational History   Not on file  Tobacco Use   Smoking status: Never   Smokeless tobacco: Never  Vaping Use   Vaping Use: Never used  Substance and Sexual Activity   Alcohol use: Never   Drug use: Never   Sexual activity: Not Currently  Other Topics Concern   Not on file  Social History Narrative   Not on file   Social Determinants of Health   Financial Resource Strain: Not on file  Food Insecurity: Not on file  Transportation Needs: Not on file  Physical Activity: Not on file  Stress: Not on file  Social Connections: Not on file  Intimate Partner Violence: Not on file     BP (!) 154/80   Pulse 79   Ht 5\' 4"  (1.626 m)   Wt 136 lb 3.2 oz (61.8 kg)   SpO2 98%   BMI 23.38 kg/m   Physical Exam:  Well appearing NAD HEENT: Unremarkable Neck:  No JVD, no thyromegally Lymphatics:  No adenopathy Back:  No CVA tenderness Lungs:  Clear with no wheezes HEART:  Regular rate rhythm, no murmurs, no  rubs, no clicks Abd:  soft, positive bowel sounds, no organomegally, no rebound, no guarding Ext:  2 plus pulses, no edema, no cyanosis, no clubbing Skin:  No rashes no nodules Neuro:  CN II through XII intact, motor grossly intact  EKG - NSR  Assess/Plan:  Atrial flutter - she is maintaining NSR after ablation. No change in her meds. Coags - I discussed the risks/benefits of maintaining her eliquis or stopping and undergo watchful waiting. She has not been falling or bleeding and very much wants to avoid a stroke. She has not had any known atrial fib but her advanced aged makes her at risk. She will continue eliquis for now. If she were to have any falls or bleeding then we would stop. She is at risk for developing atrial fib.  HTN - her sbp is up today but usually better at home. We will follow. Diastolic heart  failure - she has dyspnea on exertion. She is encouraged to avoid salty foods. No indication for a diuretic at this point.   Sharlot Gowda Eleny Cortez,MD

## 2021-07-08 ENCOUNTER — Encounter: Payer: Self-pay | Admitting: Internal Medicine

## 2021-07-08 ENCOUNTER — Other Ambulatory Visit: Payer: Self-pay

## 2021-07-08 ENCOUNTER — Ambulatory Visit: Payer: Medicare Other | Admitting: Internal Medicine

## 2021-07-08 VITALS — BP 110/60 | HR 75 | Ht 65.0 in | Wt 137.0 lb

## 2021-07-08 DIAGNOSIS — D6869 Other thrombophilia: Secondary | ICD-10-CM | POA: Diagnosis not present

## 2021-07-08 DIAGNOSIS — I483 Typical atrial flutter: Secondary | ICD-10-CM | POA: Diagnosis not present

## 2021-07-08 DIAGNOSIS — R5383 Other fatigue: Secondary | ICD-10-CM

## 2021-07-08 DIAGNOSIS — R42 Dizziness and giddiness: Secondary | ICD-10-CM | POA: Diagnosis not present

## 2021-07-08 NOTE — Progress Notes (Signed)
Cardiology Office Note:    Date:  07/08/2021   ID:  Diana Lee, DOB 1929/10/14, MRN [id number]  PCP:  [name], MD  Cardiologist:  Parke Poisson, MD  Electrophysiologist:  None   Referring MD: [name], MD   Chief Complaint/Reason for Referral: Atrial flutter  History of Present Illness:    Diana Lee is a 87 y.o. female with a history of hypothyroidism, depression, and recent episodes of atrial flutter who presents for follow-up.  Since our last visit she was seen in hospital for atrial flutter with rapid ventricular response which was symptomatic.  She underwent EP study and catheter ablation of atrial flutter on 04/08/2021 with Dr. Lewayne Bunting.  They participated in shared decision making and elected to continue apixaban 5 mg twice daily for stroke risk reduction.  Her primary concern is continued fatigue and some positional dizziness when working in her garden this summer.  This has persisted since our initial encounter in February, but has not worsened.  We discussed that this may be a combination of deconditioning and multiple procedures which have caused her to lose some of her strength.  In addition she has varicose veins, and may be having some venous pooling contributing to her symptoms.  We discussed that this could also be a medication effect but unlikely since she is on very few medications and overall tolerating them very well.  Her daughter presents with her today in follow-up and provides some additional history.  The patient denies chest pain, chest pressure, dyspnea at rest or with exertion, palpitations, PND, orthopnea, or leg swelling. Denies cough, fever, chills. Denies nausea, vomiting. Denies syncope or presyncope.   Past Medical History:  Diagnosis Date   Atrial flutter (HCC)    Depression    Thyroid disease     Past Surgical History:  Procedure Laterality Date   A-FLUTTER ABLATION N/A 04/07/2021   Procedure: A-FLUTTER ABLATION;  Surgeon:  Marinus Maw, MD;  Location: [facility] INVASIVE CV LAB;  Service: Cardiovascular;  Laterality: N/A;   CARDIOVERSION N/A 01/14/2021   Procedure: CARDIOVERSION;  Surgeon: Parke Poisson, MD;  Location: [facility] ENDOSCOPY;  Service: Cardiovascular;  Laterality: N/A;    Current Medications: Current Meds  Medication Sig   acetaminophen (TYLENOL) 650 MG CR tablet Take 650 mg by mouth every 8 (eight) hours as needed for pain.   apixaban (ELIQUIS) 5 MG TABS tablet Take 1 tablet (5 mg total) by mouth 2 (two) times daily.   Calcium Carbonate (CALTRATE 600 PO) Take 1 tablet by mouth daily.   cholecalciferol (VITAMIN D3) 25 MCG (1000 UNIT) tablet Take 1,000 Units by mouth daily.   levothyroxine (SYNTHROID) 50 MCG tablet Take 50 mcg by mouth daily before breakfast.   sertraline (ZOLOFT) 50 MG tablet Take 50 mg by mouth daily.     Allergies:   Patient has no known allergies.   Social History   Tobacco Use   Smoking status: Never   Smokeless tobacco: Never  Vaping Use   Vaping Use: Never used  Substance Use Topics   Alcohol use: Never   Drug use: Never     Family History: The patient's family history includes Stomach cancer in her mother.  ROS:   Please see the history of present illness.    All other systems reviewed and are negative.  EKGs/Labs/Other Studies Reviewed:    The following studies were reviewed today:  EKG:  SR, PACs, poor R wave progression.  Imaging studies that I have independently reviewed  today: N/A  Recent Labs: 12/20/2020: ALT 25; B Natriuretic Peptide 238.1 12/24/2020: Magnesium 2.1 04/04/2021: Hemoglobin 14.7; Platelets 165; TSH 4.231 04/05/2021: BUN 20; Creatinine, Ser 1.15; Potassium 4.6; Sodium 139  Recent Lipid Panel No results found for: CHOL, TRIG, HDL, CHOLHDL, VLDL, LDLCALC, LDLDIRECT  Physical Exam:    VS:  BP 110/60 (BP Location: Right Arm)   Pulse 75   Ht 5\' 5"  (1.651 m)   Wt 137 lb (62.1 kg)   SpO2 99%   BMI 22.80 kg/m     Wt Readings from Last 5  Encounters:  07/08/21 137 lb (62.1 kg)  05/20/21 136 lb 3.2 oz (61.8 kg)  04/06/21 132 lb 8 oz (60.1 kg)  02/25/21 137 lb 9.6 oz (62.4 kg)  01/15/21 142 lb (64.4 kg)    Constitutional: No acute distress Eyes: sclera non-icteric, normal conjunctiva and lids ENMT: normal dentition, moist mucous membranes Cardiovascular: regular rhythm, normal rate, no murmurs. S1 and S2 normal. Radial pulses normal bilaterally. No jugular venous distention.  Respiratory: clear to auscultation bilaterally GI : normal bowel sounds, soft and nontender. No distention.   MSK: extremities warm, well perfused. No edema.  NEURO: grossly nonfocal exam, moves all extremities. PSYCH: alert and oriented x 3, normal mood and affect.   ASSESSMENT:    1. Typical atrial flutter (HCC)   2. Secondary hypercoagulable state (HCC)   3. Dizziness   4. Fatigue, unspecified type    PLAN:    Typical atrial flutter (HCC) - Plan: EKG 12-Lead Secondary hypercoagulable state (HCC) -No recurrence electrically or by symptoms.  Continue Eliquis 5 mg twice daily, no significant bleeding concerns.  Dizziness - Plan: ECHOCARDIOGRAM COMPLETE, Compression stockings Fatigue, unspecified type - Plan: ECHOCARDIOGRAM COMPLETE -For fatigue and dizziness which has not relented since February, I would like to recheck an echocardiogram to ensure LV function is normal, pulmonary pressure is normal, and ensure no other contributing structural or functional factors.  I would also like for her to begin to wear compression stockings since this may help with some venous pooling and positional dizziness.  We will write her prescription for compression stockings that she can fill at the medical supply store for fitting.  Total time of encounter: 33 minutes total time of encounter, including 20 minutes spent in face-to-face patient care on the date of this encounter. This time includes coordination of care and counseling regarding above mentioned  problem list. Remainder of non-face-to-face time involved reviewing chart documents/testing relevant to the patient encounter and documentation in the medical record. I have independently reviewed documentation from referring provider.   March, MD, [facility] [place]  [facility]   Shared Decision Making/Informed Consent:       Medication Adjustments/Labs and Tests Ordered: Current medicines are reviewed at length with the patient today.  Concerns regarding medicines are outlined above.   Orders Placed This Encounter  Procedures   Compression stockings   EKG 12-Lead   ECHOCARDIOGRAM COMPLETE    No orders of the defined types were placed in this encounter.   Patient Instructions  Medication Instructions:  No Changes In Medications at this time.  *If you need a refill on your cardiac medications before your next appointment, please call your pharmacy*  Testing/Procedures: Your physician has requested that you have an echocardiogram. Echocardiography is a painless test that uses sound waves to create images of your heart. It provides your doctor with information about the size and shape of your heart and how well your heart's chambers  and valves are working. You may receive an ultrasound enhancing agent through an IV if needed to better visualize your heart during the echo.This procedure takes approximately one hour. There are no restrictions for this procedure. This will take place at the [address].   Follow-Up: At [facility], you and your health needs are our priority.  As part of our continuing mission to provide you with exceptional heart care, we have created designated Provider Care Teams.  These Care Teams include your primary Cardiologist (physician) and Advanced Practice Providers (APPs -  Physician Assistants and Nurse Practitioners) who all work together to provide you with the care you need, when you need it.  We recommend signing up for the  patient portal called "MyChart".  Sign up information is provided on this After Visit Summary.  MyChart is used to connect with patients for Virtual Visits (Telemedicine).  Patients are able to view lab/test results, encounter notes, upcoming appointments, etc.  Non-urgent messages can be sent to your provider as well.   To learn more about what you can do with MyChart, go to [email or web address].    Your next appointment:   MARCH 30th at 3pm  The format for your next appointment:   In Person  Provider:   Weston Brass, MD  Other Instructions COMPRESSION STOCKINGS 15-65mmhg

## 2021-07-08 NOTE — Patient Instructions (Addendum)
Medication Instructions:  No Changes In Medications at this time.  *If you need a refill on your cardiac medications before your next appointment, please call your pharmacy*  Testing/Procedures: Your physician has requested that you have an echocardiogram. Echocardiography is a painless test that uses sound waves to create images of your heart. It provides your doctor with information about the size and shape of your heart and how well your heart's chambers and valves are working. You may receive an ultrasound enhancing agent through an IV if needed to better visualize your heart during the echo.This procedure takes approximately one hour. There are no restrictions for this procedure. This will take place at the [address], [address].   Follow-Up: At [facility], you and your health needs are our priority.  As part of our continuing mission to provide you with exceptional heart care, we have created designated Provider Care Teams.  These Care Teams include your primary Cardiologist (physician) and Advanced Practice Providers (APPs -  Physician Assistants and Nurse Practitioners) who all work together to provide you with the care you need, when you need it.  We recommend signing up for the patient portal called "MyChart".  Sign up information is provided on this After Visit Summary.  MyChart is used to connect with patients for Virtual Visits (Telemedicine).  Patients are able to view lab/test results, encounter notes, upcoming appointments, etc.  Non-urgent messages can be sent to your provider as well.   To learn more about what you can do with MyChart, go to [email or web address].    Your next appointment:   MARCH 30th at 3pm  The format for your next appointment:   In Person  Provider:   Weston Brass, MD  Other Instructions COMPRESSION STOCKINGS 15-73mmhg

## 2021-07-29 ENCOUNTER — Other Ambulatory Visit: Payer: Self-pay

## 2021-07-29 ENCOUNTER — Ambulatory Visit (HOSPITAL_COMMUNITY): Payer: Medicare Other | Attending: Internal Medicine

## 2021-07-29 DIAGNOSIS — I4892 Unspecified atrial flutter: Secondary | ICD-10-CM | POA: Insufficient documentation

## 2021-07-29 DIAGNOSIS — R42 Dizziness and giddiness: Secondary | ICD-10-CM | POA: Diagnosis not present

## 2021-07-29 DIAGNOSIS — R5383 Other fatigue: Secondary | ICD-10-CM

## 2021-07-29 DIAGNOSIS — R55 Syncope and collapse: Secondary | ICD-10-CM | POA: Insufficient documentation

## 2021-07-29 LAB — ECHOCARDIOGRAM COMPLETE
Area-P 1/2: 3.42 cm2
S' Lateral: 2.8 cm

## 2021-09-06 DIAGNOSIS — Z23 Encounter for immunization: Secondary | ICD-10-CM | POA: Diagnosis not present

## 2021-09-27 IMAGING — DX DG CHEST 1V PORT
1 series · 1 of 1 positions shown · non-contrast
Comparison: 12/24/2004

CLINICAL DATA: Palpitations.

EXAM:
PORTABLE CHEST 1 VIEW

[chest]
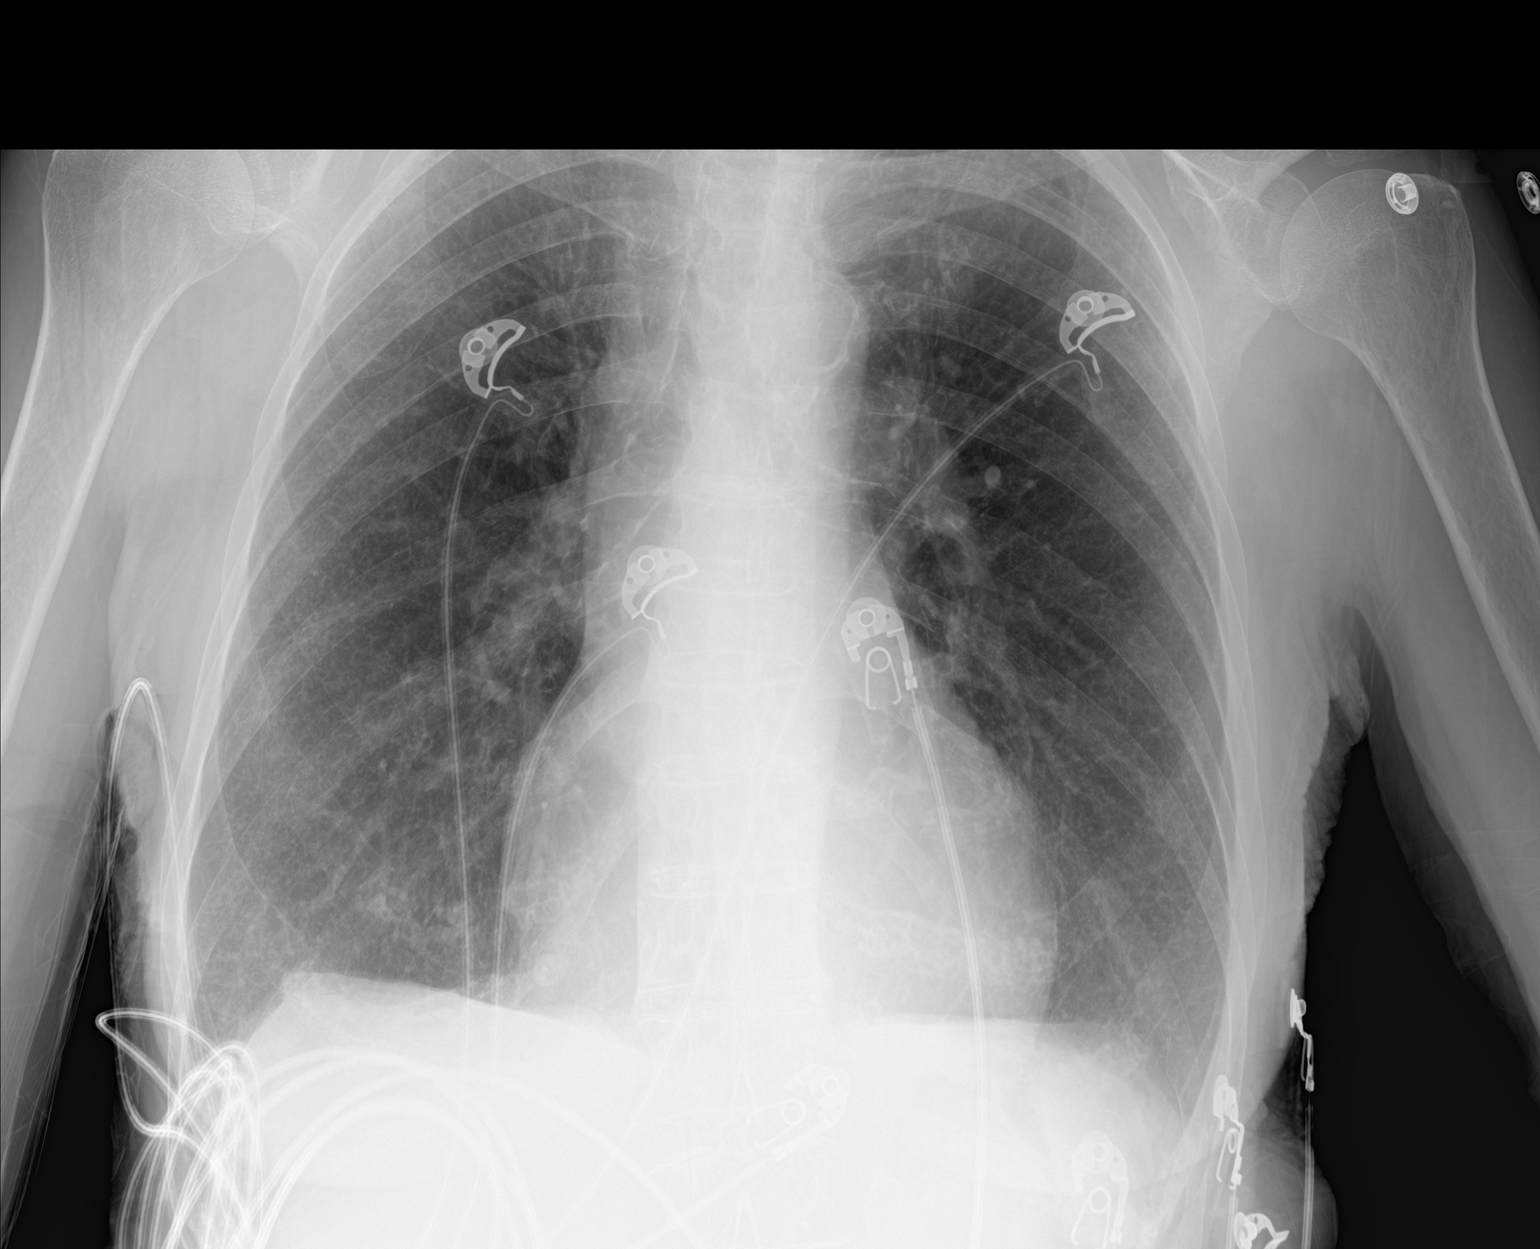

[1 of 1 positions shown; findings below may reference images not displayed]

FINDINGS: 3664 hours. Lungs are hyperexpanded. Interstitial markings are
diffusely coarsened with chronic features. The cardiopericardial
silhouette is within normal limits for size. Telemetry leads overlie
the chest.
IMPRESSION: Hyperexpansion with chronic interstitial coarsening. No acute
cardiopulmonary findings.

## 2021-10-02 DIAGNOSIS — Z1231 Encounter for screening mammogram for malignant neoplasm of breast: Secondary | ICD-10-CM | POA: Diagnosis not present

## 2021-10-08 ENCOUNTER — Other Ambulatory Visit: Payer: Self-pay | Admitting: Internal Medicine

## 2021-10-28 ENCOUNTER — Other Ambulatory Visit: Payer: Self-pay | Admitting: Internal Medicine

## 2021-11-20 DIAGNOSIS — E039 Hypothyroidism, unspecified: Secondary | ICD-10-CM | POA: Diagnosis not present

## 2021-11-20 DIAGNOSIS — D6869 Other thrombophilia: Secondary | ICD-10-CM | POA: Diagnosis not present

## 2021-11-20 DIAGNOSIS — Z79899 Other long term (current) drug therapy: Secondary | ICD-10-CM | POA: Diagnosis not present

## 2021-11-20 DIAGNOSIS — I4891 Unspecified atrial fibrillation: Secondary | ICD-10-CM | POA: Diagnosis not present

## 2021-11-20 DIAGNOSIS — F322 Major depressive disorder, single episode, severe without psychotic features: Secondary | ICD-10-CM | POA: Diagnosis not present

## 2021-11-20 DIAGNOSIS — Z0000 Encounter for general adult medical examination without abnormal findings: Secondary | ICD-10-CM | POA: Diagnosis not present

## 2021-12-15 ENCOUNTER — Other Ambulatory Visit: Payer: Self-pay | Admitting: Family Medicine

## 2021-12-15 ENCOUNTER — Ambulatory Visit
Admission: RE | Admit: 2021-12-15 | Discharge: 2021-12-15 | Disposition: A | Discharge status: Home / Self Care | Payer: Medicare Other | Source: Ambulatory Visit | Attending: Family Medicine | Admitting: Family Medicine

## 2021-12-15 ENCOUNTER — Other Ambulatory Visit: Payer: Self-pay

## 2021-12-15 DIAGNOSIS — E039 Hypothyroidism, unspecified: Secondary | ICD-10-CM | POA: Diagnosis not present

## 2021-12-15 DIAGNOSIS — R42 Dizziness and giddiness: Secondary | ICD-10-CM | POA: Diagnosis not present

## 2021-12-15 DIAGNOSIS — I4891 Unspecified atrial fibrillation: Secondary | ICD-10-CM | POA: Diagnosis not present

## 2021-12-15 DIAGNOSIS — R059 Cough, unspecified: Secondary | ICD-10-CM

## 2021-12-15 DIAGNOSIS — R5383 Other fatigue: Secondary | ICD-10-CM | POA: Diagnosis not present

## 2021-12-16 ENCOUNTER — Ambulatory Visit (INDEPENDENT_AMBULATORY_CARE_PROVIDER_SITE_OTHER): Payer: Medicare Other

## 2021-12-16 ENCOUNTER — Other Ambulatory Visit: Payer: Self-pay | Admitting: *Deleted

## 2021-12-16 ENCOUNTER — Telehealth: Payer: Self-pay

## 2021-12-16 ENCOUNTER — Encounter: Payer: Self-pay | Admitting: *Deleted

## 2021-12-16 DIAGNOSIS — I4891 Unspecified atrial fibrillation: Secondary | ICD-10-CM

## 2021-12-16 DIAGNOSIS — R42 Dizziness and giddiness: Secondary | ICD-10-CM

## 2021-12-16 NOTE — Telephone Encounter (Signed)
NOTES SCANNED TO REFERRAL 

## 2021-12-16 NOTE — Progress Notes (Unsigned)
Enrolled for [employer] to mail a ZIO XT long term holter monitor to the patients address on file.  Letter with instructions mailed to patient.  Dr. Kelvin Cellar to read/ follow up appt. Scheduled 01/29/22.

## 2021-12-18 DIAGNOSIS — I4891 Unspecified atrial fibrillation: Secondary | ICD-10-CM

## 2021-12-18 DIAGNOSIS — R42 Dizziness and giddiness: Secondary | ICD-10-CM | POA: Diagnosis not present

## 2021-12-19 ENCOUNTER — Other Ambulatory Visit: Payer: Self-pay | Admitting: Internal Medicine

## 2021-12-25 ENCOUNTER — Other Ambulatory Visit: Payer: Self-pay

## 2021-12-25 ENCOUNTER — Telehealth: Payer: Self-pay | Admitting: Internal Medicine

## 2021-12-25 MED ORDER — APIXABAN 5 MG PO TABS: 5.0000 mg | ORAL_TABLET | Freq: Two times a day (BID) | ORAL | 5 refills | Status: DC

## 2021-12-25 NOTE — Telephone Encounter (Signed)
°*  STAT* If patient is at the pharmacy, call can be transferred to refill team.   1. Which medications need to be refilled? (please list name of each medication and dose if known)  apixaban (ELIQUIS) 5 MG TABS tablet  2. Which pharmacy/location (including street and city if local pharmacy) is medication to be sent to? [id number] - [place], [place] - [address]  3. Do they need a 30 day or 90 day supply? 30 with refills  Daughter of the patient wanted to know why she has to call every month to get the medication refilled

## 2021-12-25 NOTE — Telephone Encounter (Signed)
Prescription refill request for Eliquis received. Indication:Aflutter Last office visit:9/22 Scr:1.1 Age: 88 Weight:62.1 kg  Prescription refilled

## 2022-01-12 DIAGNOSIS — R42 Dizziness and giddiness: Secondary | ICD-10-CM | POA: Diagnosis not present

## 2022-01-12 DIAGNOSIS — I472 Ventricular tachycardia, unspecified: Secondary | ICD-10-CM | POA: Diagnosis not present

## 2022-01-12 DIAGNOSIS — I471 Supraventricular tachycardia: Secondary | ICD-10-CM | POA: Diagnosis not present

## 2022-01-12 DIAGNOSIS — E039 Hypothyroidism, unspecified: Secondary | ICD-10-CM | POA: Diagnosis not present

## 2022-01-12 DIAGNOSIS — I4891 Unspecified atrial fibrillation: Secondary | ICD-10-CM | POA: Diagnosis not present

## 2022-01-13 ENCOUNTER — Telehealth: Payer: Self-pay | Admitting: Internal Medicine

## 2022-01-13 NOTE — Telephone Encounter (Signed)
Office called in to say that the report shows patient is in Ventricular tachycardia. Please advise ?

## 2022-01-13 NOTE — Telephone Encounter (Addendum)
Spoke with [facility] that reported patient's Zio monitor showed v tach and that Dr. [name] wants the patient seen before her 3/30 appointment with Dr. Jacques Navy. Spoke with Harrietta Guardian at [facility]. She made appointment with Otilio Saber, PA on 01/14/22 at 10 am and advised patient. ?

## 2022-01-14 ENCOUNTER — Other Ambulatory Visit (INDEPENDENT_AMBULATORY_CARE_PROVIDER_SITE_OTHER): Payer: Medicare Other | Admitting: *Deleted

## 2022-01-14 ENCOUNTER — Ambulatory Visit: Payer: Medicare Other | Admitting: Student

## 2022-01-14 ENCOUNTER — Other Ambulatory Visit: Payer: Self-pay

## 2022-01-14 ENCOUNTER — Encounter: Payer: Self-pay | Admitting: Student

## 2022-01-14 VITALS — BP 120/66 | HR 69 | Ht 65.0 in | Wt 138.2 lb

## 2022-01-14 DIAGNOSIS — I471 Supraventricular tachycardia: Secondary | ICD-10-CM | POA: Diagnosis not present

## 2022-01-14 DIAGNOSIS — I491 Atrial premature depolarization: Secondary | ICD-10-CM

## 2022-01-14 DIAGNOSIS — I493 Ventricular premature depolarization: Secondary | ICD-10-CM

## 2022-01-14 DIAGNOSIS — R5383 Other fatigue: Secondary | ICD-10-CM | POA: Diagnosis not present

## 2022-01-14 DIAGNOSIS — I483 Typical atrial flutter: Secondary | ICD-10-CM

## 2022-01-14 DIAGNOSIS — I4729 Other ventricular tachycardia: Secondary | ICD-10-CM

## 2022-01-14 NOTE — Progress Notes (Signed)
? ?PCP:  [name], MD ?Primary Cardiologist: Parke Poisson, MD ?Electrophysiologist: Lewayne Bunting, MD  ? ?Ronnae Kaser is a 88 y.o. female seen today for Lewayne Bunting, MD for acute visit due to abnormal Zio patch .  ? ?14 Day Zio 12/2021 showed: ?HR Min 43bpm Max 190, AVG 59  ?Predominant underlying rhythm was Sinus Rhythm.  ?5 NSVT. Fastest 160 bpm, longest 7 beats at 139 bpm.  ?345 SVT runs. Fastest 190 (5 beats). Longest 15.4 seconds at 102 bpm.  ?Some episodes of SVT -> ? AT with variable block.  ?Atrial Fibrillation occurred (<1% burden), ranging from 69-110 bpm (avg of 88 bpm) ?Longest AF 70m13s with an avg rate of 88 bpm.  ?Isolated SVEs were rare (<1.0%) ?SVE Couplets were rare (<1.0%) ?SVE Triplets were rare (<1.0%)  ?Isolated VEs were occasional (1.3%, 15793) ?VE Couplets were rare (<1.0%, 2402) ?VE Triplets were rare (<1.0%, 56). ? ? Since last being seen in our clinic the patient reports doing an overall fatigue. She states these are not specific episodes, but feels once she hits a certain point in the day or activity she feels like she has ran out of stamina.  She has occasional dizziness, but more vertiginous with rapid head turning than with position changes. No CP. No syncope. BP at home is "up and down". No palpitations.  ? ?Past Medical History:  ?Diagnosis Date  ? Atrial flutter (HCC)   ? Depression   ? Thyroid disease   ? ?Past Surgical History:  ?Procedure Laterality Date  ? A-FLUTTER ABLATION N/A 04/07/2021  ? Procedure: A-FLUTTER ABLATION;  Surgeon: Marinus Maw, MD;  Location: [facility] INVASIVE CV LAB;  Service: Cardiovascular;  Laterality: N/A;  ? CARDIOVERSION N/A 01/14/2021  ? Procedure: CARDIOVERSION;  Surgeon: Parke Poisson, MD;  Location: [facility] ENDOSCOPY;  Service: Cardiovascular;  Laterality: N/A;  ? ? ?Current Outpatient Medications  ?Medication Sig Dispense Refill  ? acetaminophen (TYLENOL) 500 MG tablet Take 500 mg by mouth every 6 (six) hours as needed for mild pain or  headache.    ? apixaban (ELIQUIS) 5 MG TABS tablet Take 1 tablet (5 mg total) by mouth 2 (two) times daily. 60 tablet 5  ? Calcium Carbonate (CALTRATE 600 PO) Take 1 tablet by mouth daily.    ? cholecalciferol (VITAMIN D3) 25 MCG (1000 UNIT) tablet Take 1,000 Units by mouth daily.    ? levothyroxine (SYNTHROID) 50 MCG tablet Take 75 mcg by mouth daily before breakfast.    ? metoprolol succinate (TOPROL-XL) 25 MG 24 hr tablet 1/2 tablet    ? sertraline (ZOLOFT) 50 MG tablet Take 50 mg by mouth daily.    ? ?No current facility-administered medications for this visit.  ? ? ?No Known Allergies ? ?Social History  ? ?Socioeconomic History  ? Marital status: Married  ?  Spouse name: Not on file  ? Number of children: Not on file  ? Years of education: Not on file  ? Highest education level: Not on file  ?Occupational History  ? Not on file  ?Tobacco Use  ? Smoking status: Never  ? Smokeless tobacco: Never  ?Vaping Use  ? Vaping Use: Never used  ?Substance and Sexual Activity  ? Alcohol use: Never  ? Drug use: Never  ? Sexual activity: Not Currently  ?Other Topics Concern  ? Not on file  ?Social History Narrative  ? Not on file  ? ?Social Determinants of Health  ? ?Financial Resource Strain: Not on file  ?Food  Insecurity: Not on file  ?Transportation Needs: Not on file  ?Physical Activity: Not on file  ?Stress: Not on file  ?Social Connections: Not on file  ?Intimate Partner Violence: Not on file  ? ? ? ?Review of Systems: ?All other systems reviewed and are otherwise negative except as noted above. ? ?Physical Exam: ?Vitals:  ? 01/14/22 1012  ?BP: 120/66  ?Pulse: 69  ?SpO2: 98%  ?Weight: 138 lb 3.2 oz (62.7 kg)  ?Height: 5\' 5"  (1.651 m)  ? ? ?GEN- The patient is well appearing, alert and oriented x 3 today.   ?HEENT: normocephalic, atraumatic; sclera clear, conjunctiva pink; hearing intact; oropharynx clear; neck supple, no JVP ?Lymph- no cervical lymphadenopathy ?Lungs- Clear to ausculation bilaterally, normal work of  breathing.  No wheezes, rales, rhonchi ?Heart- Regular rate and rhythm, no murmurs, rubs or gallops, PMI not laterally displaced ?GI- soft, non-tender, non-distended, bowel sounds present, no hepatosplenomegaly ?Extremities- no clubbing, cyanosis, or edema; DP/PT/radial pulses 2+ bilaterally ?MS- no significant deformity or atrophy ?Skin- warm and dry, no rash or lesion ?Psych- euthymic mood, full affect ?Neuro- strength and sensation are intact ? ?EKG is not ordered.  ? ?Additional studies reviewed include: ?Previous EP office notes. Recent monitor reviewed as above ? ?Assessment and Plan: ? ?1. NSVT/PACs/PVCs/SVT ?Infrequent, with longest of any episode lasting 15.4 seconds (at 'only' 102 bpm) ?I do NOT think these are responsible for her fatigue and occasional dizziness.  ?She has had profound dizziness and bradycardia on BBs, but this was on dose of 75 mg BID post cardioversion.  ?If metoprolol worsens symptoms, would stop.  ?If unchanged, could also consider stopping at visit with Dr. 08-14-1981 in several weeks vs remaining on as trial basis.  ?Ultimately, if felt to be contributive to her symptoms, One other option would be very low dose amiodarone, though she is just as likely, or more likely, to experience off target effects as she is to experience any kind of relief given the non-specificity of her symptoms. We discussed this today and she is uninterested, but as above, I do not feel treating or ectopy and non-sustained arrhythmias is likely to have any large effect on her fatigue.  ? ?2. Typical Atrial Flutter ?S/p ablation ?She had very brief AF on monitor (<4 minutes) ?She remains on eliquis by preference for CHA2DS2/VASc of at least 3. ?  ?3. HTN ?Stable on current regimen   ?  ?4. Diastolic CHF ?Limit sodium ?Volume status stable. No indication for diuretic at this point. Just as likely to overshoot.  ? ?5. Fatigue ?Likely multifactorial ?She states PCP checked thyroid and possibly blood count  yesterday. ? ?Follow up with Dr. Jacques Navy in 6 months  ? ?Ladona Ridgel, PA-C  ?01/14/22 ?10:45 AM  ?

## 2022-01-14 NOTE — Patient Instructions (Signed)
Medication Instructions:   Your physician recommends that you continue on your current medications as directed. Please refer to the Current Medication list given to you today.'  *If you need a refill on your cardiac medications before your next appointment, please call your pharmacy*   Lab Work: NONE ORDERED  TODAY   If you have labs (blood work) drawn today and your tests are completely normal, you will receive your results only by: . MyChart Message (if you have MyChart) OR . A paper copy in the mail If you have any lab test that is abnormal or we need to change your treatment, we will call you to review the results.   Testing/Procedures: NONE ORDERED  TODAY    Follow-Up: At [facility], you and your health needs are our priority.  As part of our continuing mission to provide you with exceptional heart care, we have created designated Provider Care Teams.  These Care Teams include your primary Cardiologist (physician) and Advanced Practice Providers (APPs -  Physician Assistants and Nurse Practitioners) who all work together to provide you with the care you need, when you need it.  We recommend signing up for the patient portal called "MyChart".  Sign up information is provided on this After Visit Summary.  MyChart is used to connect with patients for Virtual Visits (Telemedicine).  Patients are able to view lab/test results, encounter notes, upcoming appointments, etc.  Non-urgent messages can be sent to your provider as well.   To learn more about what you can do with MyChart, go to [email or web address].    Your next appointment:   6 month(s)  The format for your next appointment:   In Person  Provider:   Gregg Taylor, MD   Other Instructions  

## 2022-01-20 NOTE — Telephone Encounter (Signed)
Patient seen on 3/15 with [place], [place]. Patient scheduled to see Dr. Jacques Navy in follow up on 3/30.  ?

## 2022-01-29 ENCOUNTER — Ambulatory Visit: Payer: Medicare Other | Admitting: Internal Medicine

## 2022-01-29 ENCOUNTER — Encounter: Payer: Self-pay | Admitting: Internal Medicine

## 2022-01-29 VITALS — BP 170/68 | HR 63 | Ht 65.0 in | Wt 139.0 lb

## 2022-01-29 DIAGNOSIS — I483 Typical atrial flutter: Secondary | ICD-10-CM

## 2022-01-29 NOTE — Patient Instructions (Signed)
Medication Instructions:  ?No Changes In Medications at this time.  ?*If you need a refill on your cardiac medications before your next appointment, please call your pharmacy* ? ?Follow-Up: ?At [facility], you and your health needs are our priority.  As part of our continuing mission to provide you with exceptional heart care, we have created designated Provider Care Teams.  These Care Teams include your primary Cardiologist (physician) and Advanced Practice Providers (APPs -  Physician Assistants and Nurse Practitioners) who all work together to provide you with the care you need, when you need it. ? ?We recommend signing up for the patient portal called "MyChart".  Sign up information is provided on this After Visit Summary.  MyChart is used to connect with patients for Virtual Visits (Telemedicine).  Patients are able to view lab/test results, encounter notes, upcoming appointments, etc.  Non-urgent messages can be sent to your provider as well.   ?To learn more about what you can do with MyChart, go to [email or web address].   ? ?Your next appointment:   ?6 month(s) ? ?The format for your next appointment:   ?In Person ? ?Provider:   ?Gayatri A Acharya, MD   ?

## 2022-01-29 NOTE — Progress Notes (Signed)
?Cardiology Office Note:   ? ?Date:  01/29/2022  ? ?ID:  Tracia Lacomb, DOB Oct 18, 1929, MRN [id number] ? ?PCP:  [name], MD  ?Cardiologist:  Parke Poisson, MD  ?Electrophysiologist:  Lewayne Bunting, MD  ? ?Referring MD: [name], MD  ? ?Chief Complaint/Reason for Referral: ?Atrial flutter ? ?History of Present Illness:   ? ?Diana Lee is a 88 y.o. female with a history of hypothyroidism, depression, and recent episodes of atrial flutter who presents for follow-up.  She underwent EP study and catheter ablation of atrial flutter on 04/08/2021 with Dr. Lewayne Bunting.  They participated in shared decision making and elected to continue apixaban 5 mg twice daily for stroke risk reduction. ? ?Her primary concern is continued fatigue. This has persisted since our initial encounter in February, but has not worsened, and did not improve while off of metoprolol.  We discussed that this may be a combination of deconditioning and multiple procedures which have caused her to lose some of her strength. Her daughter [name] presents with her today in follow-up and provides some additional history. She had a cardiac monitor placed to evaluate for afib which showed NSVT, PACs, PVCs, SVT, and was seen by EP. Not felt that these episodes contributed to fatigue. Recommended to resume metoprolol. No change in symptoms in last two weeks. ? ?The patient denies chest pain, chest pressure, dyspnea at rest or with exertion, palpitations, PND, orthopnea, or leg swelling. Denies cough, fever, chills. Denies nausea, vomiting. Denies syncope or presyncope.  ? ?Past Medical History:  ?Diagnosis Date  ? Atrial flutter (HCC)   ? Depression   ? Thyroid disease   ? ? ?Past Surgical History:  ?Procedure Laterality Date  ? A-FLUTTER ABLATION N/A 04/07/2021  ? Procedure: A-FLUTTER ABLATION;  Surgeon: Marinus Maw, MD;  Location: [facility] INVASIVE CV LAB;  Service: Cardiovascular;  Laterality: N/A;  ? CARDIOVERSION N/A 01/14/2021  ? Procedure:  CARDIOVERSION;  Surgeon: Parke Poisson, MD;  Location: [facility] ENDOSCOPY;  Service: Cardiovascular;  Laterality: N/A;  ? ? ?Current Medications: ?Current Meds  ?Medication Sig  ? acetaminophen (TYLENOL) 500 MG tablet Take 500 mg by mouth every 6 (six) hours as needed for mild pain or headache.  ? apixaban (ELIQUIS) 5 MG TABS tablet Take 1 tablet (5 mg total) by mouth 2 (two) times daily.  ? Calcium Carbonate (CALTRATE 600 PO) Take 1 tablet by mouth daily.  ? cholecalciferol (VITAMIN D3) 25 MCG (1000 UNIT) tablet Take 1,000 Units by mouth daily.  ? levothyroxine (SYNTHROID) 50 MCG tablet Take 75 mcg by mouth daily before breakfast.  ? metoprolol succinate (TOPROL-XL) 25 MG 24 hr tablet 1/2 tablet  ? sertraline (ZOLOFT) 50 MG tablet Take 50 mg by mouth daily.  ?  ? ?Allergies:   Patient has no known allergies.  ? ?Social History  ? ?Tobacco Use  ? Smoking status: Never  ? Smokeless tobacco: Never  ?Vaping Use  ? Vaping Use: Never used  ?Substance Use Topics  ? Alcohol use: Never  ? Drug use: Never  ?  ? ?Family History: ?The patient's family history includes Stomach cancer in her mother. ? ?ROS:   ?Please see the history of present illness.    ?All other systems reviewed and are negative. ? ?EKGs/Labs/Other Studies Reviewed:   ? ?The following studies were reviewed today: ? ?EKG:  SR, poor R wave progression ? ?Imaging studies that I have independently reviewed today: N/A ? ?Recent Labs: ?04/04/2021: Hemoglobin 14.7; Platelets 165; TSH 4.231 ?  04/05/2021: BUN 20; Creatinine, Ser 1.15; Potassium 4.6; Sodium 139  ?Recent Lipid Panel ?No results found for: CHOL, TRIG, HDL, CHOLHDL, VLDL, LDLCALC, LDLDIRECT ? ?Physical Exam:   ? ?VS:  BP (!) 170/68   Pulse 63   Ht 5\' 5"  (1.651 m)   Wt 139 lb (63 kg)   SpO2 99%   BMI 23.13 kg/m?    ? ?Wt Readings from Last 5 Encounters:  ?01/29/22 139 lb (63 kg)  ?01/14/22 138 lb 3.2 oz (62.7 kg)  ?07/08/21 137 lb (62.1 kg)  ?05/20/21 136 lb 3.2 oz (61.8 kg)  ?04/06/21 132 lb 8 oz (60.1  kg)  ?  ?Constitutional: No acute distress ?Eyes: sclera non-icteric, normal conjunctiva and lids ?ENMT: normal dentition, moist mucous membranes ?Cardiovascular: regular rhythm, normal rate, no murmurs. S1 and S2 normal. Radial pulses normal bilaterally. No jugular venous distention.  ?Respiratory: clear to auscultation bilaterally ?GI : normal bowel sounds, soft and nontender. No distention.   ?MSK: extremities warm, well perfused. No edema.  ?NEURO: grossly nonfocal exam, moves all extremities. ?PSYCH: alert and oriented x 3, normal mood and affect.  ? ?ASSESSMENT:   ? ?1. Typical atrial flutter (HCC)   ? ? ?PLAN:   ? ?Typical atrial flutter (HCC) - Plan: EKG 12-Lead ?Secondary hypercoagulable state (HCC) ?-No recurrence electrically or by symptoms.  Continue Eliquis 5 mg twice daily, no significant bleeding concerns. ?-brief episode of afib on monitor, no change required. ? ?Dizziness  ?Fatigue, unspecified type ?-did not change while off of metoprolol nor worsened when she resumed 2 weeks ago. Continue to observe, likely secondary to deconditioning. ? ?Total time of encounter: ?20 minutes total time of encounter, including 15 minutes spent in face-to-face patient care on the date of this encounter. This time includes coordination of care and counseling regarding above mentioned problem list. Remainder of non-face-to-face time involved reviewing chart documents/testing relevant to the patient encounter and documentation in the medical record. I have independently reviewed documentation from referring provider.  ? ?Weston BrassGayatri Mauri Temkin, MD, [facility]wayFACC ?[place]  [facility]  ? ? ?Shared Decision Making/Informed Consent:   ?   ? ?Medication Adjustments/Labs and Tests Ordered: ?Current medicines are reviewed at length with the patient today.  Concerns regarding medicines are outlined above.  ? ?Orders Placed This Encounter  ?Procedures  ? EKG 12-Lead  ? ? ?No orders of the defined types were placed in this  encounter. ? ? ? ?Patient Instructions  ?Medication Instructions:  ?No Changes In Medications at this time.  ?*If you need a refill on your cardiac medications before your next appointment, please call your pharmacy* ? ?Follow-Up: ?[facility] you and your health needs are our priority.  As part of our continuing mission to provide you with exceptional heart care, we have created designated Provider Care Teams.  These Care Teams include your primary Cardiologist (physician) and Advanced Practice Providers (APPs -  Physician Assistants and Nurse Practitioners) who all work together to provide you with the care you need, when you need it. ? ?We recommend signing up for the patient portal called "MyChart".  Sign up information is provided on this After Visit Summary.  MyChart is used to connect with patients for Virtual Visits (Telemedicine).  Patients are able to view lab/test results, encounter notes, upcoming appointments, etc.  Non-urgent messages can be sent to your provider as well.   ?To learn more about what you can do with MyChart, go to [email or web address].   ? ?Your next appointment:   ?  6 month(s) ? ?The format for your next appointment:   ?In Person ? ?Provider:   ?Parke Poisson, MD   ?  ? ? ? ?

## 2022-02-03 DIAGNOSIS — H903 Sensorineural hearing loss, bilateral: Secondary | ICD-10-CM | POA: Diagnosis not present

## 2022-05-19 DIAGNOSIS — H52203 Unspecified astigmatism, bilateral: Secondary | ICD-10-CM | POA: Diagnosis not present

## 2022-05-19 DIAGNOSIS — Z961 Presence of intraocular lens: Secondary | ICD-10-CM | POA: Diagnosis not present

## 2022-05-19 DIAGNOSIS — H524 Presbyopia: Secondary | ICD-10-CM | POA: Diagnosis not present

## 2022-06-01 DIAGNOSIS — D6869 Other thrombophilia: Secondary | ICD-10-CM | POA: Diagnosis not present

## 2022-06-01 DIAGNOSIS — R5383 Other fatigue: Secondary | ICD-10-CM | POA: Diagnosis not present

## 2022-06-01 DIAGNOSIS — E039 Hypothyroidism, unspecified: Secondary | ICD-10-CM | POA: Diagnosis not present

## 2022-06-01 DIAGNOSIS — I4891 Unspecified atrial fibrillation: Secondary | ICD-10-CM | POA: Diagnosis not present

## 2022-06-25 DIAGNOSIS — L570 Actinic keratosis: Secondary | ICD-10-CM | POA: Diagnosis not present

## 2022-06-25 DIAGNOSIS — L578 Other skin changes due to chronic exposure to nonionizing radiation: Secondary | ICD-10-CM | POA: Diagnosis not present

## 2022-06-25 DIAGNOSIS — C44612 Basal cell carcinoma of skin of right upper limb, including shoulder: Secondary | ICD-10-CM | POA: Diagnosis not present

## 2022-06-25 DIAGNOSIS — D485 Neoplasm of uncertain behavior of skin: Secondary | ICD-10-CM | POA: Diagnosis not present

## 2022-07-11 ENCOUNTER — Other Ambulatory Visit: Payer: Self-pay | Admitting: Internal Medicine

## 2022-07-15 ENCOUNTER — Ambulatory Visit: Payer: Medicare Other | Admitting: Internal Medicine

## 2022-07-15 ENCOUNTER — Telehealth: Payer: Self-pay | Admitting: Internal Medicine

## 2022-07-15 DIAGNOSIS — I483 Typical atrial flutter: Secondary | ICD-10-CM

## 2022-07-15 MED ORDER — APIXABAN 5 MG PO TABS: 5.0000 mg | ORAL_TABLET | Freq: Two times a day (BID) | ORAL | 5 refills | Status: DC

## 2022-07-15 NOTE — Telephone Encounter (Signed)
*  STAT* If patient is at the pharmacy, call can be transferred to refill team.   1. Which medications need to be refilled? (please list name of each medication and dose if known)   apixaban (ELIQUIS) 5 MG TABS tablet    2. Which pharmacy/location (including street and city if local pharmacy) is medication to be sent to? [id number] - [place], [place] - [address]  3. Do they need a 30 day or 90 day supply?  30 day

## 2022-07-15 NOTE — Telephone Encounter (Signed)
Eliquis 5mg  refill request received. Patient is 88 years old, weight-63kg, Crea- 0.92 on 06/01/2022 via [facility] from [place], Diagnosis-Aflutter, and last seen by Dr. Natrona heights on 01/29/2022. Dose is appropriate based on dosing criteria. Will send in refill to requested pharmacy.

## 2022-08-03 ENCOUNTER — Ambulatory Visit: Payer: Medicare Other | Attending: Internal Medicine | Admitting: Internal Medicine

## 2022-08-03 VITALS — BP 100/66 | HR 68 | Ht 65.0 in | Wt 137.8 lb

## 2022-08-03 DIAGNOSIS — D6869 Other thrombophilia: Secondary | ICD-10-CM | POA: Diagnosis not present

## 2022-08-03 DIAGNOSIS — I4729 Other ventricular tachycardia: Secondary | ICD-10-CM | POA: Diagnosis not present

## 2022-08-03 DIAGNOSIS — I493 Ventricular premature depolarization: Secondary | ICD-10-CM

## 2022-08-03 DIAGNOSIS — E038 Other specified hypothyroidism: Secondary | ICD-10-CM

## 2022-08-03 DIAGNOSIS — I483 Typical atrial flutter: Secondary | ICD-10-CM

## 2022-08-03 DIAGNOSIS — R5383 Other fatigue: Secondary | ICD-10-CM | POA: Diagnosis not present

## 2022-08-03 DIAGNOSIS — I491 Atrial premature depolarization: Secondary | ICD-10-CM

## 2022-08-03 DIAGNOSIS — I471 Supraventricular tachycardia, unspecified: Secondary | ICD-10-CM

## 2022-08-03 NOTE — Progress Notes (Signed)
Cardiology Office Note:    Date:  08/03/2022   ID:  Diana Lee, DOB 06/06/1929, MRN [id number]  PCP:  [name], MD  Cardiologist:  Parke Poisson, MD  Electrophysiologist:  Lewayne Bunting, MD   Referring MD: [name], MD   Chief Complaint/Reason for Referral: Atrial flutter  History of Present Illness:    Diana Lee is a 88 y.o. female with a history of hypothyroidism, depression, and recent episodes of atrial flutter who presents for follow-up.  She underwent EP study and catheter ablation of atrial flutter on 04/08/2021 with Dr. Lewayne Bunting.  They participated in shared decision making and elected to continue apixaban 5 mg twice daily for stroke risk reduction.  At her last appointment she was concerned about her fatigue which had persisted since her initial visit in February. Her fatigue was stable, but did not improve while off of metoprolol.  We discussed that this may be a combination of deconditioning and multiple procedures which have caused her to lose some of her strength. She had a cardiac monitor placed to evaluate for afib which showed NSVT, PACs, PVCs, SVT, and was seen by EP. Not felt that these episodes contributed to fatigue. Recommended to resume metoprolol. No change in symptoms in prior two weeks. We planned to continue observation of her symptoms, likely secondary to deconditioning.  Today, her daughter [name] presents with her in follow-up and provides some additional history.  She states that she has been busy because she loves it. She denies shortness of breath but does feels fatigued when doing too much. This also makes her feel weaker. If she is not as active, she will not feel as fatigued. They note that her fatigue has been stable and seems controllable.  Last month, she had 1 episode of palpitations.   She is still taking Eliquis, due to insurance donut hole, at times is quite costly.  She has been compliant with Eliquis.  She denies bleeding  issues on the eliquis.    She has arthritis in both knees and hands, and has been taking tylenol and Biofreeze for pain control.  Tylenol dosing guidelines reviewed.  She denies any chest pain, or peripheral edema. No lightheadedness, headaches, syncope, orthopnea, or PND.   She recently had surgical removal of skin cancer on her right forearm and face.  Sites appear grossly well-healing.   Past Medical History:  Diagnosis Date   Atrial flutter (HCC)    Depression    Thyroid disease     Past Surgical History:  Procedure Laterality Date   A-FLUTTER ABLATION N/A 04/07/2021   Procedure: A-FLUTTER ABLATION;  Surgeon: Marinus Maw, MD;  Location: [facility] INVASIVE CV LAB;  Service: Cardiovascular;  Laterality: N/A;   CARDIOVERSION N/A 01/14/2021   Procedure: CARDIOVERSION;  Surgeon: Parke Poisson, MD;  Location: [facility] ENDOSCOPY;  Service: Cardiovascular;  Laterality: N/A;    Current Medications: Current Meds  Medication Sig   acetaminophen (TYLENOL) 500 MG tablet Take 500 mg by mouth every 6 (six) hours as needed for mild pain or headache.   apixaban (ELIQUIS) 5 MG TABS tablet Take 1 tablet (5 mg total) by mouth 2 (two) times daily.   Calcium Carbonate (CALTRATE 600 PO) Take 1 tablet by mouth daily.   cholecalciferol (VITAMIN D3) 25 MCG (1000 UNIT) tablet Take 1,000 Units by mouth daily.   levothyroxine (SYNTHROID) 50 MCG tablet Take 75 mcg by mouth daily before breakfast.   metoprolol succinate (TOPROL-XL) 25 MG 24 hr tablet 1/2 tablet  sertraline (ZOLOFT) 50 MG tablet Take 50 mg by mouth daily.     Allergies:   Patient has no known allergies.   Social History   Tobacco Use   Smoking status: Never   Smokeless tobacco: Never  Vaping Use   Vaping Use: Never used  Substance Use Topics   Alcohol use: Never   Drug use: Never     Family History: The patient's family history includes Stomach cancer in her mother.  ROS:   Please see the history of present illness.    (+)  Fatigue (+) Isolated episode of palpitations (+) Arthralgias All other systems reviewed and are negative.  EKGs/Labs/Other Studies Reviewed:    The following studies were reviewed today:  Monitor  12/2021: Patch Wear Time:  13 days and 22 hours (2023-02-16T17:12:05-499 to 2023-03-02T15:42:30-0500)   Patient had a min HR of 43 bpm, max HR of 190 bpm, and avg HR of 59 bpm. Predominant underlying rhythm was Sinus Rhythm. 5 Ventricular Tachycardia runs occurred, the run with the fastest interval lasting 6 beats with a max rate of 160 bpm, the longest  lasting 7 beats with an avg rate of 139 bpm. 345 Supraventricular Tachycardia runs occurred, the run with the fastest interval lasting 5 beats with a max rate of 190 bpm, the longest lasting 15.4 secs with an avg rate of 102 bpm. Some episodes of  Supraventricular Tachycardia may be possible Atrial Tachycardia with variable block. Atrial Fibrillation occurred (<1% burden), ranging from 69-110 bpm (avg of 88 bpm), the longest lasting 3 mins 13 secs with an avg rate of 88 bpm. Isolated SVEs were  rare (<1.0%), SVE Couplets were rare (<1.0%), and SVE Triplets were rare (<1.0%). Isolated VEs were occasional (1.3%, 15793), VE Couplets were rare (<1.0%, 2402), and VE Triplets were rare (<1.0%, 56).  Echo  07/29/2021:  1. Left ventricular ejection fraction, by estimation, is 60 to 65%. Left  ventricular ejection fraction by 3D volume is 56 %. The left ventricle has  normal function. The left ventricle has no regional wall motion  abnormalities. Left ventricular diastolic   parameters are consistent with Grade I diastolic dysfunction (impaired  relaxation). The average left ventricular global longitudinal strain is  -21.1 %. The global longitudinal strain is normal.   2. Right ventricular systolic function is normal. The right ventricular  size is normal. There is normal pulmonary artery systolic pressure.   3. The mitral valve is degenerative. No  evidence of mitral valve  regurgitation. No evidence of mitral stenosis.   4. The aortic valve is tricuspid. There is mild calcification of the  aortic valve. Aortic valve regurgitation is not visualized. No aortic  stenosis is present.   5. The inferior vena cava is normal in size with greater than 50%  respiratory variability, suggesting right atrial pressure of 3 mmHg.   Comparison(s): A prior study was performed on 12/21/20. 12/21/20 EF 60-65%.   A-Flutter Ablation  04/07/2021: CONCLUSIONS:  1. Isthmus-dependent right atrial flutter upon presentation.  2. Successful radiofrequency ablation of atrial flutter along the cavotricuspid isthmus with complete bidirectional isthmus block achieved.  3. No inducible arrhythmias following ablation.  4. No early apparent complications.    EKG:  EKG is personally reviewed. 08/03/2022:  Sinus rhythm with poor R wave progression.  01/29/2022:  SR, poor R wave progression  Imaging studies that I have independently reviewed today: N/A  Recent Labs: No results found for requested labs within last 365 days.   Recent Lipid Panel No  results found for: "CHOL", "TRIG", "HDL", "CHOLHDL", "VLDL", "LDLCALC", "LDLDIRECT"  Physical Exam:    VS:  BP 100/66   Pulse 68   Ht 5\' 5"  (1.651 m)   Wt 137 lb 12.8 oz (62.5 kg)   SpO2 98%   BMI 22.93 kg/m     Wt Readings from Last 5 Encounters:  08/03/22 137 lb 12.8 oz (62.5 kg)  01/29/22 139 lb (63 kg)  01/14/22 138 lb 3.2 oz (62.7 kg)  07/08/21 137 lb (62.1 kg)  05/20/21 136 lb 3.2 oz (61.8 kg)    Constitutional: No acute distress Eyes: sclera non-icteric, normal conjunctiva and lids ENMT: normal dentition, moist mucous membranes Cardiovascular: regular rhythm, normal rate, no murmurs. S1 and S2 normal. Radial pulses normal bilaterally. No jugular venous distention.  Respiratory: clear to auscultation bilaterally GI : normal bowel sounds, soft and nontender. No distention.   MSK: extremities warm, well  perfused. No edema.  NEURO: grossly nonfocal exam, moves all extremities. PSYCH: alert and oriented x 3, normal mood and affect.   ASSESSMENT:    1. Typical atrial flutter ([place])   2. Secondary hypercoagulable state ([place])   3. Fatigue, unspecified type   4. NSVT (nonsustained ventricular tachycardia) ([place])   5. SVT (supraventricular tachycardia)   6. PVC's (premature ventricular contractions)   7. PAC (premature atrial contraction)   8. Other specified hypothyroidism      PLAN:    Typical atrial flutter ([place]) - Plan: EKG 12-Lead Secondary hypercoagulable state ([place]) -No recurrence electrically.  1 isolated episode of palpitations in the last 6 months.  Brief and not bothersome.  Continue Eliquis 5 mg twice daily, no significant bleeding concerns.  Meets threshold for full dose Eliquis by weight and renal function. -brief episode of afib on prior cardiac monitor, no change required.  Dizziness  Fatigue, unspecified type -did not change while off of metoprolol nor worsened when she resumed it.  Continue to observe, likely secondary to deconditioning.  Encouraged her to remain active.  She feels symptoms are bothersome but tolerable.  Follow-up:  6 months  Total time of encounter: 30 minutes total time of encounter, including 20 minutes spent in face-to-face patient care on the date of this encounter. This time includes coordination of care and counseling regarding above mentioned problem list. Remainder of non-face-to-face time involved reviewing chart documents/testing relevant to the patient encounter and documentation in the medical record. I have independently reviewed documentation from referring provider.   Cherlynn Kaiser, MD, [place]    Shared Decision Making/Informed Consent:       Medication Adjustments/Labs and Tests Ordered: Current medicines are reviewed at length with the patient today.  Concerns regarding medicines are outlined above.    No orders of the defined types were placed in this encounter.  No orders of the defined types were placed in this encounter.  Patient Instructions  Medication Instructions:  No Changes In Medications at this time.  *If you need a refill on your cardiac medications before your next appointment, please call your pharmacy*  Follow-Up: At [facility], you and your health needs are our priority.  As part of our continuing mission to provide you with exceptional heart care, we have created designated Provider Care Teams.  These Care Teams include your primary Cardiologist (physician) and Advanced Practice Providers (APPs -  Physician Assistants and Nurse Practitioners) who all work together to provide you with the care you need, when you need it.   Your  next appointment:   6 month(s)  The format for your next appointment:   In Person  Provider:   Parke Poisson, MD           [facility],acting as a scribe for Parke Poisson, MD.,have documented all relevant documentation on the behalf of Parke Poisson, MD,as directed by  Parke Poisson, MD while in the presence of Parke Poisson, MD.  I, Parke Poisson, MD, have reviewed all documentation for this visit. The documentation on 08/03/22 for the exam, diagnosis, procedures, and orders are all accurate and complete.

## 2022-08-03 NOTE — Patient Instructions (Signed)
Medication Instructions:  No Changes In Medications at this time.  *If you need a refill on your cardiac medications before your next appointment, please call your pharmacy*  Follow-Up: At [facility], you and your health needs are our priority.  As part of our continuing mission to provide you with exceptional heart care, we have created designated Provider Care Teams.  These Care Teams include your primary Cardiologist (physician) and Advanced Practice Providers (APPs -  Physician Assistants and Nurse Practitioners) who all work together to provide you with the care you need, when you need it.  Your next appointment:   6 month(s)  The format for your next appointment:   In Person  Provider:   Gayatri A Acharya, MD          

## 2022-08-05 NOTE — Addendum Note (Signed)
Addended by: Lubertha Sayres on: 08/05/2022 01:37 PM   Modules accepted: Orders

## 2022-09-22 IMAGING — DX DG CHEST 2V
2 series · 2 of 2 positions shown · non-contrast
Comparison: 04/04/2021

CLINICAL DATA: [AGE] female with fatigue and cough

EXAM:
CHEST - 2 VIEW

[dg chest 2 view (1 of 2)]
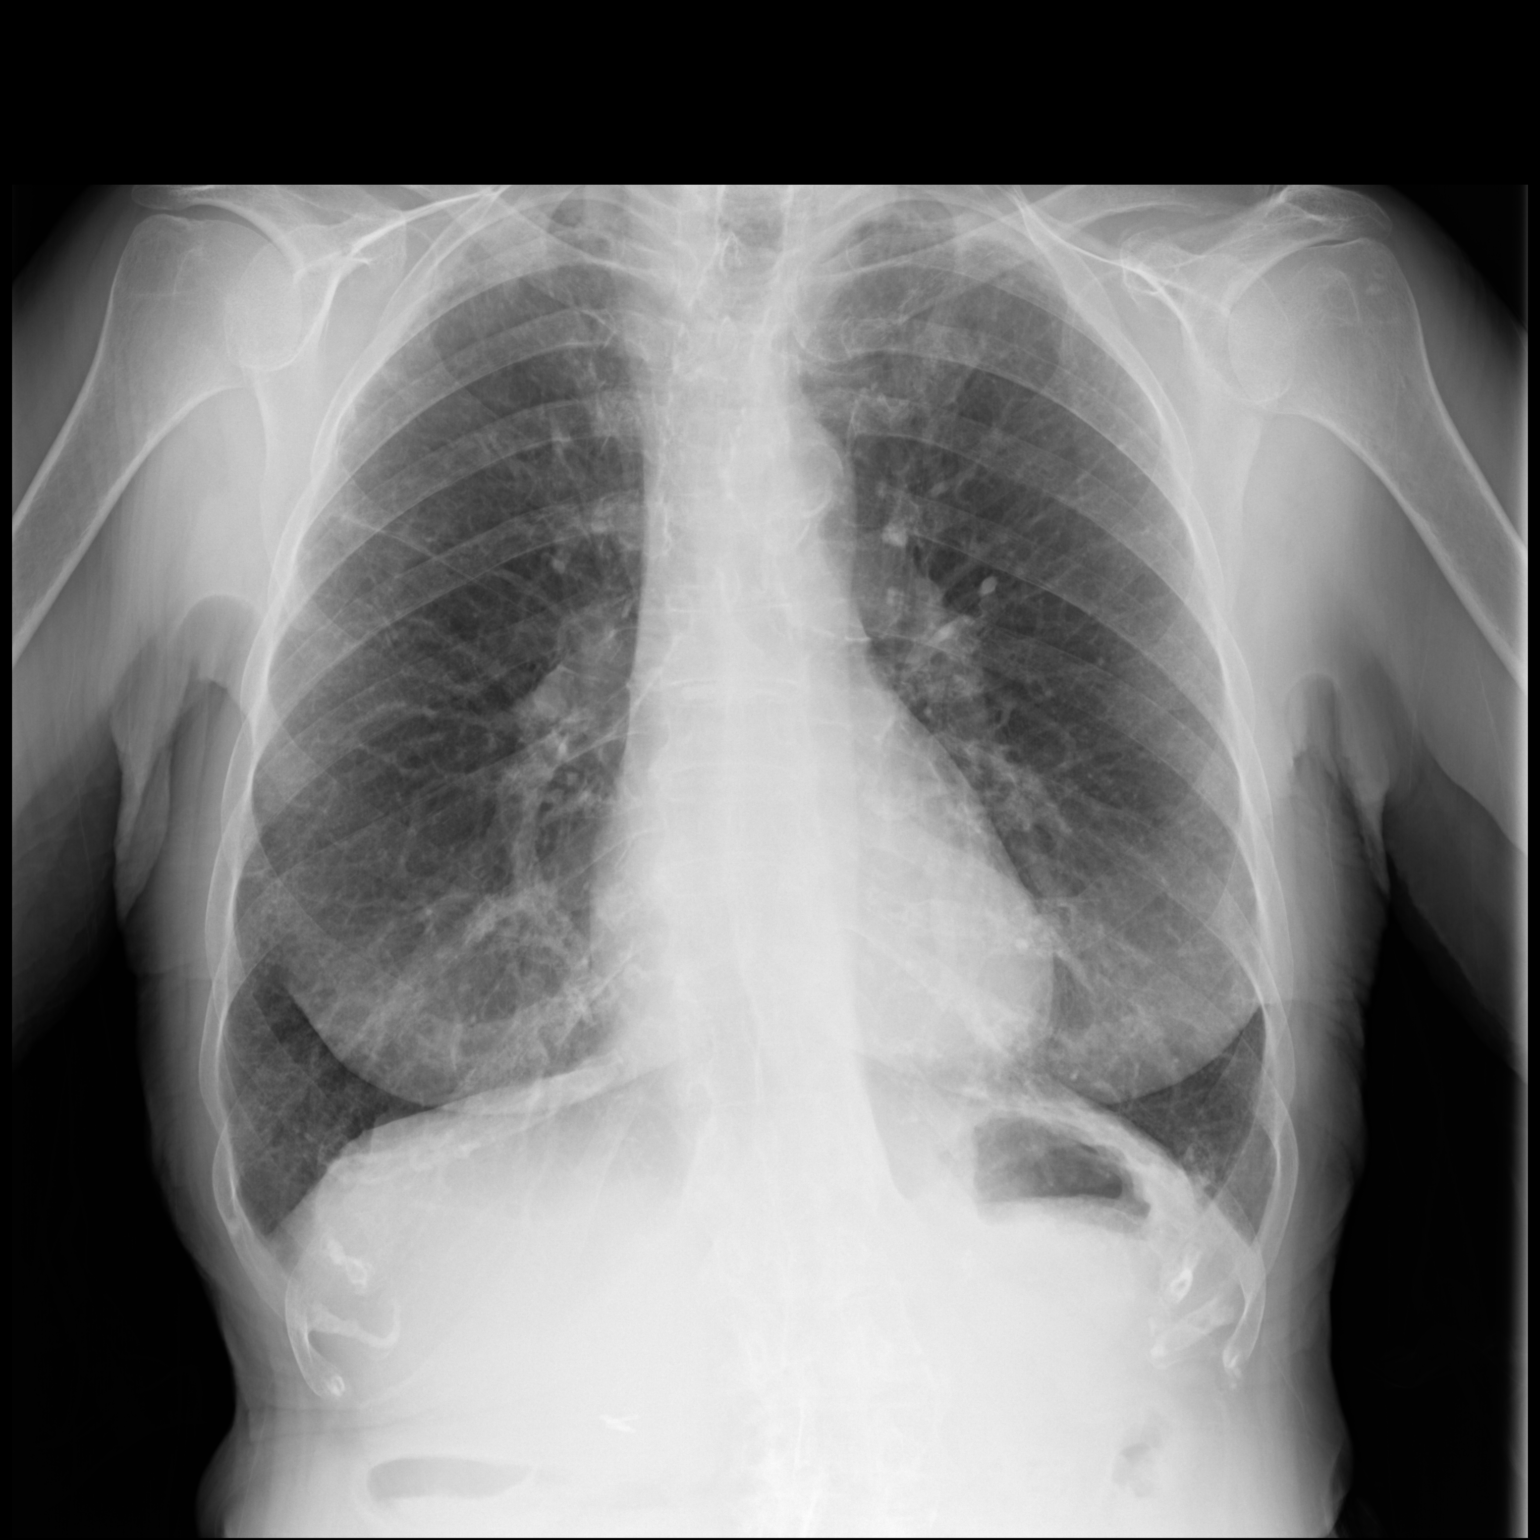

[dg chest 2 view (2 of 2)]
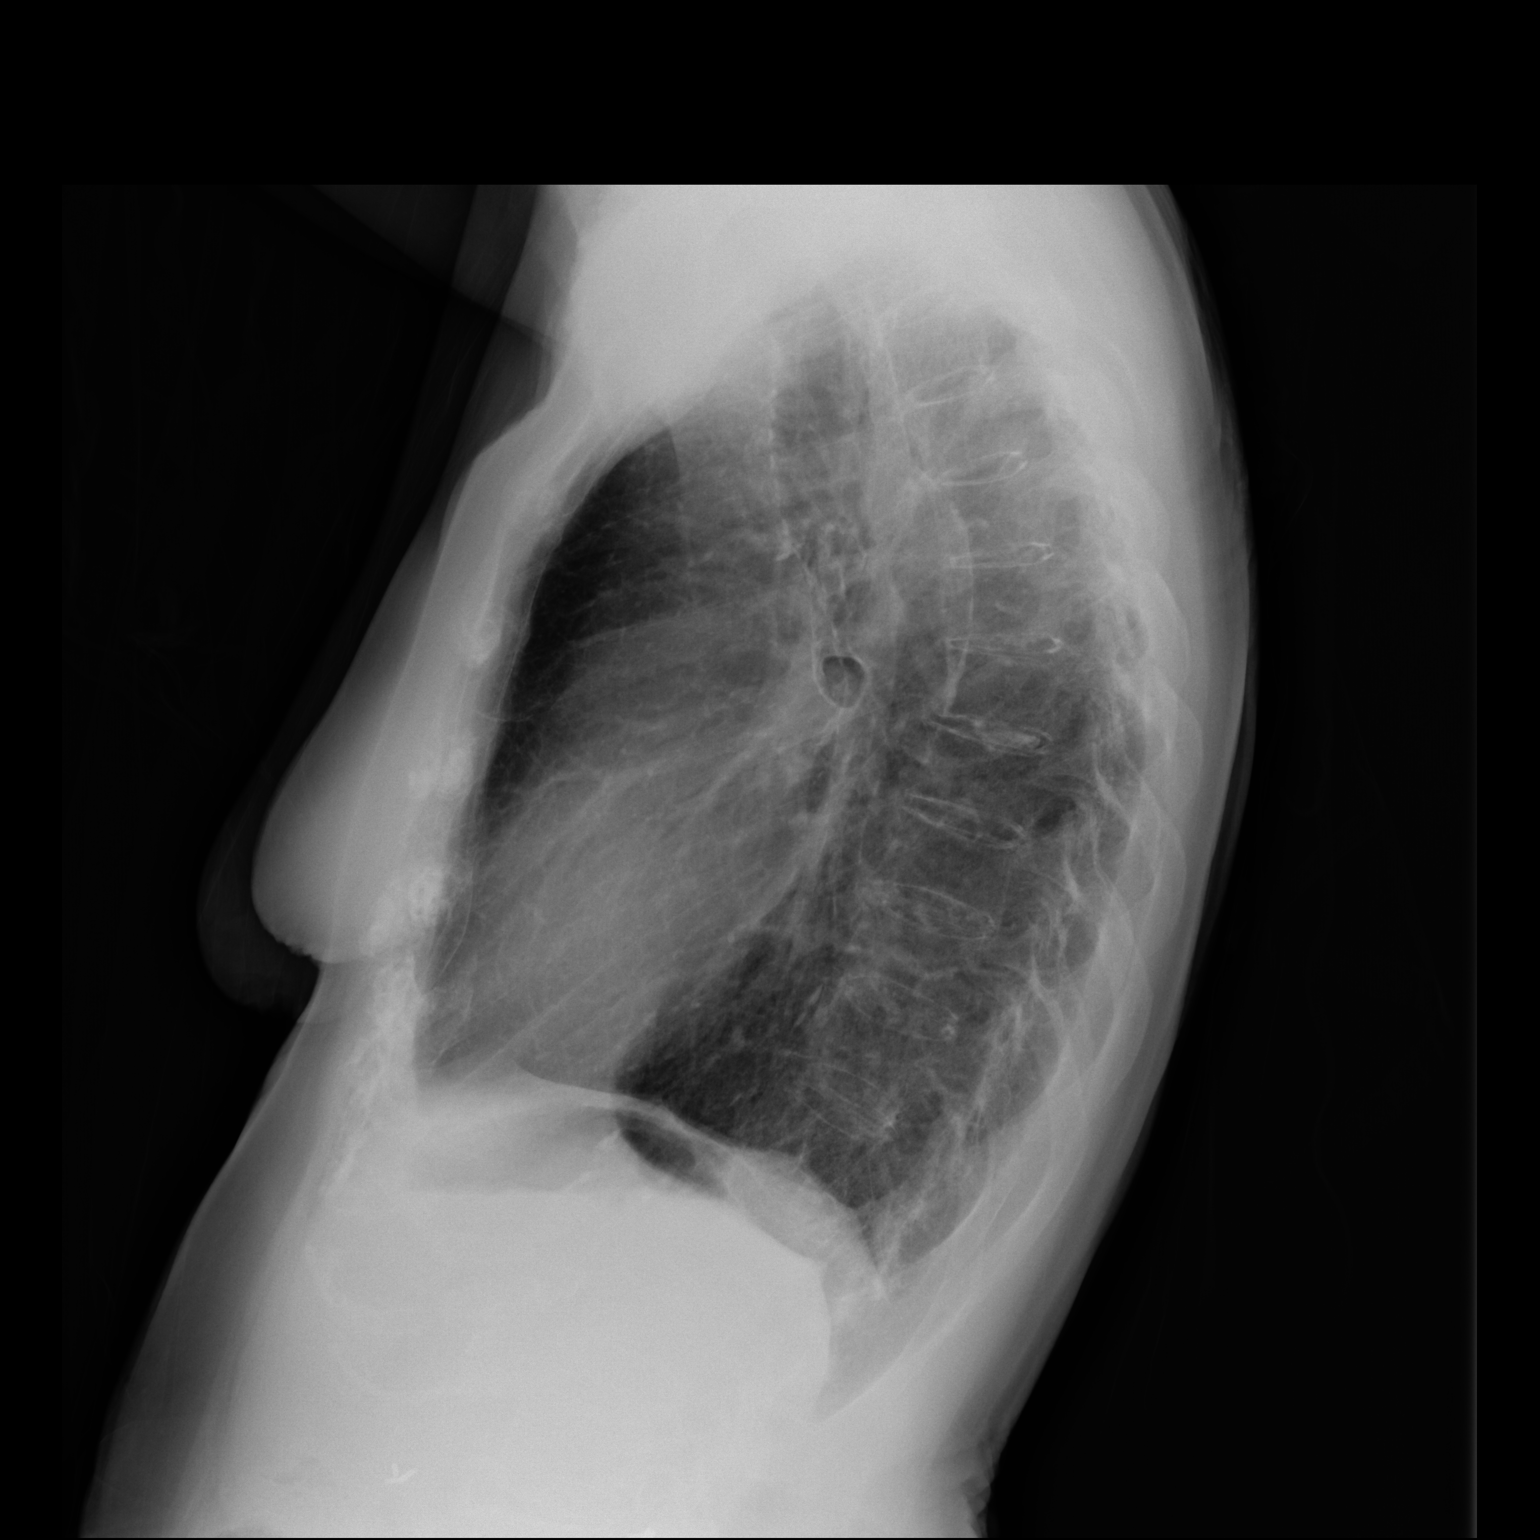

[2 of 2 positions shown; findings below may reference images not displayed]

FINDINGS: Cardiomediastinal silhouette unchanged in size and contour. No
evidence of central vascular congestion. No interlobular septal
thickening.

Similar appearance of fine reticular opacities in the periphery of
the lungs throughout, without a specific gradient. No new confluent
airspace disease, with coarsened interstitial markings.

Stigmata of emphysema, with increased retrosternal airspace,
flattened hemidiaphragms, increased AP diameter, and hyperinflation
on the AP view.

No pneumothorax or pleural effusion.

No acute displaced fracture. Degenerative changes of the spine.
IMPRESSION: Changes of emphysema and developing fibrosis, similar to the
comparison chest x-ray with no definite evidence of acute
cardiopulmonary disease

## 2022-09-28 DIAGNOSIS — L570 Actinic keratosis: Secondary | ICD-10-CM | POA: Diagnosis not present

## 2022-09-28 DIAGNOSIS — Z09 Encounter for follow-up examination after completed treatment for conditions other than malignant neoplasm: Secondary | ICD-10-CM | POA: Diagnosis not present

## 2022-09-28 DIAGNOSIS — D485 Neoplasm of uncertain behavior of skin: Secondary | ICD-10-CM | POA: Diagnosis not present

## 2022-11-12 DIAGNOSIS — I4891 Unspecified atrial fibrillation: Secondary | ICD-10-CM | POA: Diagnosis not present

## 2022-11-12 DIAGNOSIS — D6869 Other thrombophilia: Secondary | ICD-10-CM | POA: Diagnosis not present

## 2022-11-12 DIAGNOSIS — R5383 Other fatigue: Secondary | ICD-10-CM | POA: Diagnosis not present

## 2022-11-12 DIAGNOSIS — E039 Hypothyroidism, unspecified: Secondary | ICD-10-CM | POA: Diagnosis not present

## 2022-11-24 ENCOUNTER — Telehealth: Payer: Self-pay | Admitting: Internal Medicine

## 2022-11-24 NOTE — Telephone Encounter (Signed)
Per Dr. Margaretann Loveless   Aspirin will not adequately anticoagulate. Recommend staying on eliquis for stroke risk reduction. If she chooses to stop eliquis, I would not recommend aspirin in it's place. I suspect metoprolol was more of a culprit than eliquis. GA   Returned call to patients daughter and advised her of Dr. Delphina Cahill recommendations. Advised her okay to hold metop but patient should restart the Eliquis as soon as possible. Patients daughter states that she would like to know if patient could take a lower dose. Advised her I would check with PharmD and let her know.

## 2022-11-24 NOTE — Telephone Encounter (Signed)
Spoke with daughter Laruth Bouchard who stated patient took herself of eliquis on 11/19/22 due to feeling tired and weak. One week before that, she stopped taking metoprolol. Daughter stated that as of today, pt feels less fatigued. She is staying hydrated and eating well. Explained risks of not being on eliquis. Daughter stated she understood. Patient wants to stay of eliquis and start taking ASA. Please advise.

## 2022-11-24 NOTE — Telephone Encounter (Signed)
Pt c/o medication issue:  1. Name of Medication:  apixaban (ELIQUIS) 5 MG TABS tablet  Baby aspirin  2. How are you currently taking this medication (dosage and times per day)? Not currently taking  3. Are you having a reaction (difficulty breathing--STAT)? no  4. What is your medication issue? Patient's daughter states the patient decided to stop her eliquis, because at her PCP they concluded it was making her feel bad. She says the patient was feeling extreme fatigue and muscle aches while on the medication. She would like to know when the patient should start the baby aspirin.

## 2022-11-24 NOTE — Telephone Encounter (Signed)
She qualifies for Eliquis 5mg  BID based on her age, weight, and renal function. If she lowers the dose, she would not be appropriately anticoagulated.  However, her weight is decently close to the threshold for dose reduction. If her weight drops below 132 lbs, she should let us know as she would then qualify for Eliquis 2.5mg  BID dosing.

## 2022-11-26 NOTE — Telephone Encounter (Signed)
Returned call to patients daughter who states that patient has not stopped the Eliquis 5mg  BID. Patient has stopped the Metoprolol. Patient still fatigued and overall feels run down per patients daughter- offered next available slot with Dr. Margaretann Loveless but patient unable to make that, scheduled patient to see Dr. Margaretann Loveless on Thursday Feb 1st at 1:40. Patients daughter aware of appointment time and date- advised her to call back with any concerns. Patients daughter verbalized understanding.

## 2022-12-03 ENCOUNTER — Ambulatory Visit: Payer: Medicare Other | Attending: Internal Medicine | Admitting: Internal Medicine

## 2022-12-03 ENCOUNTER — Encounter: Payer: Self-pay | Admitting: Internal Medicine

## 2022-12-03 VITALS — BP 134/72 | HR 76 | Ht 60.0 in | Wt 140.6 lb

## 2022-12-03 DIAGNOSIS — I483 Typical atrial flutter: Secondary | ICD-10-CM

## 2022-12-03 DIAGNOSIS — I471 Supraventricular tachycardia, unspecified: Secondary | ICD-10-CM | POA: Diagnosis not present

## 2022-12-03 DIAGNOSIS — D6869 Other thrombophilia: Secondary | ICD-10-CM | POA: Diagnosis not present

## 2022-12-03 DIAGNOSIS — R5383 Other fatigue: Secondary | ICD-10-CM | POA: Diagnosis not present

## 2022-12-03 NOTE — Patient Instructions (Signed)
Medication Instructions:  STAY OFF METOPROLOL  *If you need a refill on your cardiac medications before your next appointment, please call your pharmacy*  Lab Work: None Ordered At This Time.  If you have labs (blood work) drawn today and your tests are completely normal, you will receive your results only by: [place] (if you have MyChart) OR A paper copy in the mail If you have any lab test that is abnormal or we need to change your treatment, we will call you to review the results.  Testing/Procedures: None Ordered At This Time.   Follow-Up: At [facility], you and your health needs are our priority.  As part of our continuing mission to provide you with exceptional heart care, we have created designated Provider Care Teams.  These Care Teams include your primary Cardiologist (physician) and Advanced Practice Providers (APPs -  Physician Assistants and Nurse Practitioners) who all work together to provide you with the care you need, when you need it.  Your next appointment:   6 month(s)  Provider:   Elouise Munroe, MD

## 2022-12-03 NOTE — Progress Notes (Signed)
Cardiology Office Note:    Date:  12/03/2022   ID:  Diana Lee, DOB June 11, 1929, MRN [id number]  PCP:  Mayra Neer, MD  Cardiologist:  Elouise Munroe, MD  Electrophysiologist:  Cristopher Peru, MD   Referring MD: Mayra Neer, MD   Chief Complaint/Reason for Referral: Atrial flutter  History of Present Illness:    Diana Lee is a 89 y.o. female with a history of hypothyroidism, depression, and recent episodes of atrial flutter who presents for follow-up.  She underwent EP study and catheter ablation of atrial flutter on 04/08/2021 with Dr. Cristopher Peru.  They participated in shared decision making and elected to continue apixaban 5 mg twice daily for stroke risk reduction.  At her 01/29/2022 appointment she was concerned about her fatigue which had persisted since her initial visit in February. Her fatigue was stable, but did not improve while off of metoprolol.  We discussed that this may be a combination of deconditioning and multiple procedures which have caused her to lose some of her strength. She had a cardiac monitor placed to evaluate for afib which showed NSVT, PACs, PVCs, SVT, and was seen by EP. Not felt that these episodes contributed to fatigue. Recommended to resume metoprolol. No change in symptoms in prior two weeks. We planned to continue observation of her symptoms, likely secondary to deconditioning. Last visit she denied any SOB and her fatigue seemed more controllable, and she had only one episode of palpitations in the previous month.  Today, the patient presents with her son to help provide history. He states that her blood pressure has been variable lately. She was stressed at [place] having to cook for her whole family, and has been extra fatigued since. She had to take frequent breaks while cooking to rest due to her fatigue.  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  This speaks volumes about her  functional capacity.  She believes her fatigue to be related to having low blood pressure for the past few months. However in clinic today it was measured at 155/67 on recheck it was 134/72.  She had been taking Metoprolol  with no change in symptoms when discontinuing. She also recently discussed discontinuing Eliquis given unresolved fatigue.  We have participated in shared decision making again given possibility of recurrent or silent atrial arrhythmia which could contribute to stroke risk.  She has not stopped Eliquis and will not stop at this time given shared decision making and wanting to avoid embolic stroke.  I think this is reasonable.  She did however stop her metoprolol and perhaps feels a bit better.  Recall that the patient had previously stopped metoprolol and had a recurrence of tachypalpitations which were intolerable and required restarting beta-blockade which is why we have continued her on this all this time.  She has been having a sinus infection recently for the past three weeks which has been making her feel worse, she had no associated fever.  Since she has been improving after her upper respiratory infection it is hard for her to know whether the metoprolol or her resolving URI is contributing to her feeling better.  She has also gained weight recently which is displeasing to her.  She denies any chest pain, shortness of breath, or peripheral edema. No lightheadedness, headaches, syncope, orthopnea, or PND.  Past Medical History:  Diagnosis Date   Atrial flutter ([place])    Depression    Thyroid  disease     Past Surgical History:  Procedure Laterality Date   A-FLUTTER ABLATION N/A 04/07/2021   Procedure: A-FLUTTER ABLATION;  Surgeon: Evans Lance, MD;  Location: [place] [facility];  Service: Cardiovascular;  Laterality: N/A;   CARDIOVERSION N/A 01/14/2021   Procedure: CARDIOVERSION;  Surgeon: Elouise Munroe, MD;  Location[facility] ENDOSCOPY;  Service: Cardiovascular;   Laterality: N/A;    Current Medications: Current Meds  Medication Sig   acetaminophen (TYLENOL) 500 MG tablet Take 500 mg by mouth every 6 (six) hours as needed for mild pain or headache.   apixaban (ELIQUIS) 5 MG TABS tablet Take 1 tablet (5 mg total) by mouth 2 (two) times daily.   Calcium Carbonate (CALTRATE 600 PO) Take 1 tablet by mouth daily.   cholecalciferol (VITAMIN D3) 25 MCG (1000 UNIT) tablet Take 1,000 Units by mouth daily.   levothyroxine (SYNTHROID) 50 MCG tablet Take 75 mcg by mouth daily before breakfast.   sertraline (ZOLOFT) 50 MG tablet Take 50 mg by mouth daily.     Allergies:   Patient has no known allergies.   Social History   Tobacco Use   Smoking status: Never   Smokeless tobacco: Never  Vaping Use   Vaping Use: Never used  Substance Use Topics   Alcohol use: Never   Drug use: Never     Family History: The patient's family history includes Stomach cancer in her mother.  ROS:   Please see the history of present illness.    (+) Fatigue All other systems reviewed and are negative.  EKGs/Labs/Other Studies Reviewed:    The following studies were reviewed today:  Monitor  12/2021: Patch Wear Time:  13 days and 22 hours (2023-02-16T17:12:05-499 to 2023-03-02T15:42:30-0500)   Patient had a min HR of 43 bpm, max HR of 190 bpm, and avg HR of 59 bpm. Predominant underlying rhythm was Sinus Rhythm. 5 Ventricular Tachycardia runs occurred, the run with the fastest interval lasting 6 beats with a max rate of 160 bpm, the longest  lasting 7 beats with an avg rate of 139 bpm. 345 Supraventricular Tachycardia runs occurred, the run with the fastest interval lasting 5 beats with a max rate of 190 bpm, the longest lasting 15.4 secs with an avg rate of 102 bpm. Some episodes of  Supraventricular Tachycardia may be possible Atrial Tachycardia with variable block. Atrial Fibrillation occurred (<1% burden), ranging from 69-110 bpm (avg of 88 bpm), the longest lasting 3  mins 13 secs with an avg rate of 88 bpm. Isolated SVEs were  rare (<1.0%), SVE Couplets were rare (<1.0%), and SVE Triplets were rare (<1.0%). Isolated VEs were occasional (1.3%, 15793), VE Couplets were rare (<1.0%, 2402), and VE Triplets were rare (<1.0%, 56).  Echo  07/29/2021:  1. Left ventricular ejection fraction, by estimation, is 60 to 65%. Left  ventricular ejection fraction by 3D volume is 56 %. The left ventricle has  normal function. The left ventricle has no regional wall motion  abnormalities. Left ventricular diastolic   parameters are consistent with Grade I diastolic dysfunction (impaired  relaxation). The average left ventricular global longitudinal strain is  -21.1 %. The global longitudinal strain is normal.   2. Right ventricular systolic function is normal. The right ventricular  size is normal. There is normal pulmonary artery systolic pressure.   3. The mitral valve is degenerative. No evidence of mitral valve  regurgitation. No evidence of mitral stenosis.   4. The aortic valve is tricuspid. There is  mild calcification of the  aortic valve. Aortic valve regurgitation is not visualized. No aortic  stenosis is present.   5. The inferior vena cava is normal in size with greater than 50%  respiratory variability, suggesting right atrial pressure of 3 mmHg.   Comparison(s): A prior study was performed on 12/21/20. 12/21/20 EF 60-65%.   A-Flutter Ablation  04/07/2021: CONCLUSIONS:  1. Isthmus-dependent right atrial flutter upon presentation.  2. Successful radiofrequency ablation of atrial flutter along the cavotricuspid isthmus with complete bidirectional isthmus block achieved.  3. No inducible arrhythmias following ablation.  4. No early apparent complications.    EKG:  The EKG is personally reviewed. 12/03/2022; Sr, poor R wave progression. 08/03/2022:  Sinus rhythm with poor R wave progression.  01/29/2022:  SR, poor R wave progression  Imaging studies that I have  independently reviewed today: N/A  Recent Labs: No results found for requested labs within last 365 days.   Recent Lipid Panel No results found for: "CHOL", "TRIG", "HDL", "CHOLHDL", "VLDL", "LDLCALC", "LDLDIRECT"  Physical Exam:    VS:  BP 134/72 (BP Location: Right Arm, Patient Position: Sitting, Cuff Size: Normal)   Pulse 76   Ht 5' (1.524 m)   Wt 140 lb 9.6 oz (63.8 kg)   SpO2 99%   BMI 27.46 kg/m     Wt Readings from Last 5 Encounters:  12/03/22 140 lb 9.6 oz (63.8 kg)  08/03/22 137 lb 12.8 oz (62.5 kg)  01/29/22 139 lb (63 kg)  01/14/22 138 lb 3.2 oz (62.7 kg)  07/08/21 137 lb (62.1 kg)    Constitutional: No acute distress Eyes: sclera non-icteric, normal conjunctiva and lids ENMT: normal dentition, moist mucous membranes Cardiovascular: regular rhythm, normal rate, no murmurs. S1 and S2 normal. Radial pulses normal bilaterally. No jugular venous distention.  Respiratory: clear to auscultation bilaterally GI : normal bowel sounds, soft and nontender. No distention.   MSK: extremities warm, well perfused. No edema.  NEURO: grossly nonfocal exam, moves all extremities. PSYCH: alert and oriented x 3, normal mood and affect.   ASSESSMENT:    1. Fatigue, unspecified type   2. Typical atrial flutter ([place])   3. Secondary hypercoagulable state ([place])   4. SVT (supraventricular tachycardia)       PLAN:    Typical atrial flutter ([place]) - Plan: EKG 12-Lead Secondary hypercoagulable state ([place]) -No recurrence electrically.  Continue Eliquis 5 mg twice daily, no significant bleeding concerns.  Meets threshold for full dose Eliquis by weight and renal function.  We discussed continued Eliquis and shared decision making.  Patient would very much like to avoid embolic stroke and I cannot exclude this possibility if she were to come off of anticoagulation. -brief episode of afib on prior cardiac monitor, no change required. -She has participated in medication management with her  primary care physician who has stopped her metoprolol.  She does feel slightly better.  She had previously had SVT which was symptomatic when she stopped beta-blockade in the past.  I have advised her not to discard her metoprolol so that she may use it as needed if palpitations recur.  Reasonable to be off of metoprolol if she feels better.  Dizziness  Fatigue, unspecified type -did not change while off of metoprolol nor worsened when she resumed it.  Continue to observe, likely secondary to deconditioning.  Encouraged her to remain active.  She feels symptoms are bothersome but tolerable.   Total time of encounter: 30 minutes total time of encounter, including 20  minutes spent in face-to-face patient care on the date of this encounter. This time includes coordination of care and counseling regarding above mentioned problem list. Remainder of non-face-to-face time involved reviewing chart documents/testing relevant to the patient encounter and documentation in the medical record. I have independently reviewed documentation from referring provider.   Cherlynn Kaiser, MD, [place]    Shared Decision Making/Informed Consent:       Medication Adjustments/Labs and Tests Ordered: Current medicines are reviewed at length with the patient today.  Concerns regarding medicines are outlined above.   Orders Placed This Encounter  Procedures   EKG 12-Lead   No orders of the defined types were placed in this encounter.  Patient Instructions  Medication Instructions:  STAY OFF METOPROLOL  *If you need a refill on your cardiac medications before your next appointment, please call your pharmacy*  Lab Work: None Ordered At This Time.  If you have labs (blood work) drawn today and your tests are completely normal, you will receive your results only by: [place] (if you have MyChart) OR A paper copy in the mail If you have any lab test that is abnormal or we need to  change your treatment, we will call you to review the results.  Testing/Procedures: None Ordered At This Time.   Follow-Up: At [facility], you and your health needs are our priority.  As part of our continuing mission to provide you with exceptional heart care, we have created designated Provider Care Teams.  These Care Teams include your primary Cardiologist (physician) and Advanced Practice Providers (APPs -  Physician Assistants and Nurse Practitioners) who all work together to provide you with the care you need, when you need it.  Your next appointment:   6 month(s)  Provider:   Elouise Munroe, MD       I,Coren O'Brien,acting as a scribe for Elouise Munroe, MD.,have documented all relevant documentation on the behalf of Elouise Munroe, MD,as directed by  Elouise Munroe, MD while in the presence of Elouise Munroe, MD.  I, Elouise Munroe, MD, have reviewed all documentation for the visit on 12/03/2022. The documentation on today's date of service for the exam, diagnosis, procedures, and orders are all accurate and complete.

## 2022-12-16 DIAGNOSIS — D6869 Other thrombophilia: Secondary | ICD-10-CM | POA: Diagnosis not present

## 2022-12-16 DIAGNOSIS — Z0000 Encounter for general adult medical examination without abnormal findings: Secondary | ICD-10-CM | POA: Diagnosis not present

## 2022-12-16 DIAGNOSIS — F322 Major depressive disorder, single episode, severe without psychotic features: Secondary | ICD-10-CM | POA: Diagnosis not present

## 2022-12-16 DIAGNOSIS — I472 Ventricular tachycardia, unspecified: Secondary | ICD-10-CM | POA: Diagnosis not present

## 2022-12-16 DIAGNOSIS — I4891 Unspecified atrial fibrillation: Secondary | ICD-10-CM | POA: Diagnosis not present

## 2023-01-04 ENCOUNTER — Other Ambulatory Visit: Payer: Self-pay | Admitting: Internal Medicine

## 2023-01-04 ENCOUNTER — Telehealth: Payer: Self-pay | Admitting: Internal Medicine

## 2023-01-04 DIAGNOSIS — I483 Typical atrial flutter: Secondary | ICD-10-CM

## 2023-01-04 NOTE — Telephone Encounter (Signed)
Spoke with daughter she states that these BP numbers were this morning @ about 830am.she states that she thinks that pt is back in AFIB. Pt had originally stopped her metoprolol "awhile ago" but her restarted today.  She states that she is not with the pt right now. I will call pt and see what BP is now. Called pt she states that right now her BP is back to normal at 150/82 HR 66. She started the Metoprolol Succ '25mg'$   1/2 tab (12.'5mg'$  daily) yesterday because of the BP and her symptoms. She will continue to take her BP/HR.  OK to continue the Metoprolol Succ 12.'5mg'$  daily?

## 2023-01-04 NOTE — Telephone Encounter (Signed)
Pt son Kasandra Knudsen notified, he will have pt continue Metoprolol Succ '25mg'$  1/2 tab and continue to take BP/HR as well and call if anything else is abnormal.

## 2023-01-04 NOTE — Telephone Encounter (Signed)
STAT if HR is under 50 or over 120 (normal HR is 60-100 beats per minute)  What is your heart rate? 135  Do you have a log of your heart rate readings (document readings)? Yes  Do you have any other symptoms? Almost fainted; BP was 91/65; dizzy/lightheaded

## 2023-01-06 ENCOUNTER — Telehealth: Payer: Self-pay | Admitting: Internal Medicine

## 2023-01-06 DIAGNOSIS — I483 Typical atrial flutter: Secondary | ICD-10-CM

## 2023-01-06 NOTE — Telephone Encounter (Signed)
*  STAT* If patient is at the pharmacy, call can be transferred to refill team.   1. Which medications need to be refilled? (please list name of each medication and dose if known)   apixaban (ELIQUIS) 5 MG TABS tablet  Take 1 tablet (5 mg total) by mouth 2 (two) times daily.     2. Which pharmacy/location (including street and city if local pharmacy) is medication to be sent to?  [id number] [place], [place] - [address]    3. Do they need a 30 day or 90 day supply? 320Day Supply  Pt's daughter is trying to get the mother approved patient assistance. Pt's daughter is going to bring paperwork to the office. For now they are only requesting for a 30 day supply

## 2023-01-07 ENCOUNTER — Telehealth: Payer: Self-pay | Admitting: Internal Medicine

## 2023-01-07 MED ORDER — APIXABAN 5 MG PO TABS: 5.0000 mg | ORAL_TABLET | Freq: Two times a day (BID) | ORAL | 0 refills | Status: DC

## 2023-01-07 NOTE — Telephone Encounter (Signed)
Prescription refill request for Eliquis received. Indication: a flutter Last office visit: 12/03/22 Scr: 0.91 11/12/22 kpn Age: 89 Weight: 63kg

## 2023-01-07 NOTE — Telephone Encounter (Signed)
Called daughter and informed her that Dr Margaretann Loveless is out of the office. She will call PCP and see if they will.  I will forward as well.

## 2023-01-07 NOTE — Telephone Encounter (Signed)
Patient's daughter is following up. She states the patient is not doing well and due to her age she would like to know if Dr. Margaretann Loveless can refer her to home health.

## 2023-01-19 NOTE — Telephone Encounter (Signed)
Returned call to patient and advised her referral to home health should come from PCP- patient aware and verbalized understanding.

## 2023-01-26 DIAGNOSIS — D485 Neoplasm of uncertain behavior of skin: Secondary | ICD-10-CM | POA: Diagnosis not present

## 2023-01-26 DIAGNOSIS — L820 Inflamed seborrheic keratosis: Secondary | ICD-10-CM | POA: Diagnosis not present

## 2023-01-26 DIAGNOSIS — L570 Actinic keratosis: Secondary | ICD-10-CM | POA: Diagnosis not present

## 2023-01-26 DIAGNOSIS — L448 Other specified papulosquamous disorders: Secondary | ICD-10-CM | POA: Diagnosis not present

## 2023-01-26 DIAGNOSIS — Z09 Encounter for follow-up examination after completed treatment for conditions other than malignant neoplasm: Secondary | ICD-10-CM | POA: Diagnosis not present

## 2023-01-26 DIAGNOSIS — L821 Other seborrheic keratosis: Secondary | ICD-10-CM | POA: Diagnosis not present

## 2023-02-16 DIAGNOSIS — Z1231 Encounter for screening mammogram for malignant neoplasm of breast: Secondary | ICD-10-CM | POA: Diagnosis not present

## 2023-02-20 ENCOUNTER — Telehealth: Payer: Self-pay | Admitting: Student

## 2023-02-20 NOTE — Telephone Encounter (Addendum)
    The patient's daughter called the after hours line reporting she has had an elevated HR this morning and has felt weak. HR has been in the 130's and is usually well-controlled. She had just taken her Toprol-XL 12.5mg  at the time of this call, therefore I recommended they continue to monitor for now. We reviewed she could take an extra 12.5mg  in 3-4 hours if rates remained elevated and SBP was greater than 100. Reviewed warning signs to monitor for which would warrant ED evaluation but they wish to avoid if able. She does have a history of paroxysmal atrial flutter and has converted in the past with BB therapy. They voiced understanding of this and were appreciative of the return call.   Signed, Ellsworth Lennox, PA-C 02/20/2023, 2:51 PM

## 2023-02-22 ENCOUNTER — Telehealth: Payer: Self-pay | Admitting: Internal Medicine

## 2023-02-22 NOTE — Telephone Encounter (Signed)
Returned call to patient's daughter Tawyna left message on personal voice to call back.

## 2023-02-22 NOTE — Telephone Encounter (Signed)
Patient's daughter is returning call.

## 2023-02-22 NOTE — Telephone Encounter (Signed)
Spoke to patient's daughter [name].She stated mother is in Afib.She called PA on call this past weekend and was advised to take a extra 1/2 Metoprolol 25 mg.Stated mother is going down hill.She feels weak and light headed.B/P and pulse up and down B/P at present 117/80 pulse 98. She requested appointment with Dr.Acharya.Appointment scheduled with Dr.Acharya 4/25 at 4:00 pm.She wanted to know if heart rate increases can she take a extra 1/2 of Metoprolol.Advised I will send message to Dr.Acharya for advice.

## 2023-02-22 NOTE — Telephone Encounter (Signed)
Patient c/o Palpitations:  High priority if patient c/o lightheadedness, shortness of breath, or chest pain  How long have you had palpitations/irregular HR/ Afib? Are you having the symptoms now? 3 days, not sure if the pt is in Afib now  Are you currently experiencing lightheadedness, SOB or CP? No   Do you have a history of afib (atrial fibrillation) or irregular heart rhythm? Yes  Have you checked your BP or HR? (document readings if available):  BP fluctuating 127/93 this morning around 10:00 am. Yesterday, 04/21 104/59. Saturday, 02/20/23 it was 150's/98   Are you experiencing any other symptoms? Lightheadedness when moving around   Daughter is wanting to know if they should continue giving patient an extra 1/2 tablet of metoprolol as previously advised over the weekend due to HR. Please advise.

## 2023-02-22 NOTE — Telephone Encounter (Signed)
Spoke to patient's daughter [name] Dr.Acharya's advice given.Appointment rescheduled to tomorrow 4/23 at 10:00 am.

## 2023-02-23 ENCOUNTER — Encounter: Payer: Self-pay | Admitting: Internal Medicine

## 2023-02-23 ENCOUNTER — Ambulatory Visit: Payer: Medicare Other | Attending: Internal Medicine | Admitting: Internal Medicine

## 2023-02-23 VITALS — BP 138/63 | HR 67 | Ht 65.0 in | Wt 141.6 lb

## 2023-02-23 DIAGNOSIS — I4891 Unspecified atrial fibrillation: Secondary | ICD-10-CM

## 2023-02-23 DIAGNOSIS — I491 Atrial premature depolarization: Secondary | ICD-10-CM

## 2023-02-23 DIAGNOSIS — D6869 Other thrombophilia: Secondary | ICD-10-CM | POA: Diagnosis not present

## 2023-02-23 DIAGNOSIS — I471 Supraventricular tachycardia, unspecified: Secondary | ICD-10-CM

## 2023-02-23 DIAGNOSIS — I483 Typical atrial flutter: Secondary | ICD-10-CM | POA: Diagnosis not present

## 2023-02-23 DIAGNOSIS — R5383 Other fatigue: Secondary | ICD-10-CM | POA: Diagnosis not present

## 2023-02-23 DIAGNOSIS — I4729 Other ventricular tachycardia: Secondary | ICD-10-CM

## 2023-02-23 DIAGNOSIS — R42 Dizziness and giddiness: Secondary | ICD-10-CM

## 2023-02-23 DIAGNOSIS — E038 Other specified hypothyroidism: Secondary | ICD-10-CM

## 2023-02-23 DIAGNOSIS — I493 Ventricular premature depolarization: Secondary | ICD-10-CM

## 2023-02-23 NOTE — Patient Instructions (Signed)
Medication Instructions:  No Changes In Medications at this time.  *If you need a refill on your cardiac medications before your next appointment, please call your pharmacy*  Follow-Up: At [facility], you and your health needs are our priority.  As part of our continuing mission to provide you with exceptional heart care, we have created designated Provider Care Teams.  These Care Teams include your primary Cardiologist (physician) and Advanced Practice Providers (APPs -  Physician Assistants and Nurse Practitioners) who all work together to provide you with the care you need, when you need it.  Your next appointment:   AS SCHEDULED IN AUGUST   Provider:   Parke Poisson, MD   LIBERALIZE SALT INTAKE  PLEASE GET COMPRESSION STOCKINGS- YOU CAN BUY THESE AT [employer] OR ONLINE

## 2023-02-23 NOTE — Progress Notes (Signed)
Cardiology Office Note:    Date:  02/23/2023   ID:  Diana Lee, DOB 07-28-1929, MRN [id number]  PCP:  [name], MD  Cardiologist:  Parke Poisson, MD  Electrophysiologist:  Lewayne Bunting, MD   Referring MD: [name], MD   Chief Complaint/Reason for Referral: Atrial flutter  History of Present Illness:    Diana Lee is a 89 y.o. female with a history of hypothyroidism, depression, and recent episodes of atrial flutter who presents for follow-up.  She underwent EP study and catheter ablation of atrial flutter on 04/08/2021 with Dr. Lewayne Bunting.  They participated in shared decision making and elected to continue apixaban 5 mg twice daily for stroke risk reduction.  02/23/23:  She made an acute care visit for symptoms of dizziness and lightheadedness that coincide with tachypalpitations and heart rates in the 140s.  She does at this time have blood pressures that are low and symptomatic around 100s over 60s.  Her blood pressure however has been labile and at times will be up to 157/98.  After our visit in February she took a brief hiatus from her metoprolol but experienced 1 of these episodes of tachycardia and was instructed to resume her metoprolol.  She did so and while she was off metoprolol noticed no significant difference in her fatigue.  She resume metoprolol, but approximately once a week will have an episode of feeling weak, and again the blood pressure is low at this time.  It does not always coincide with tachycardia or palpitations.  She is in sinus rhythm on EKG today.  Her daughter with her today notes that she is slowing down and feels she is declining, but overall she feels better today than she has in the last several days.  She is looking forward to summer with warm weather to be able to get outside which is when she feels the best.  We discussed compression stockings and liberalizing salt intake for the benefit of hypotension that is symptomatic.  We discussed  wearing a cardiac monitor but in shared decision making we feel that these episodes are already defined and she is already covered with a blood thinner and rate control agents.  Prior visit: At her 01/29/2022 appointment she was concerned about her fatigue which had persisted since her initial visit in February. Her fatigue was stable, but did not improve while off of metoprolol.  We discussed that this may be a combination of deconditioning and multiple procedures which have caused her to lose some of her strength. She had a cardiac monitor placed to evaluate for afib which showed NSVT, PACs, PVCs, SVT, and was seen by EP. Not felt that these episodes contributed to fatigue. Recommended to resume metoprolol. No change in symptoms in prior two weeks. We planned to continue observation of her symptoms, likely secondary to deconditioning. Last visit she denied any SOB and her fatigue seemed more controllable, and she had only one episode of palpitations in the previous month.  Today, the patient presents with her son to help provide history. He states that her blood pressure has been variable lately. She was stressed at christmas having to cook for her whole family, and has been extra fatigued since. She had to take frequent breaks while cooking to rest due to her fatigue.  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  This speaks volumes about her functional capacity.  She believes her fatigue to  be related to having low blood pressure for the past few months. However in clinic today it was measured at 155/67 on recheck it was 134/72.  She had been taking Metoprolol  with no change in symptoms when discontinuing. She also recently discussed discontinuing Eliquis given unresolved fatigue.  We have participated in shared decision making again given possibility of recurrent or silent atrial arrhythmia which could contribute to stroke risk.  She has not  stopped Eliquis and will not stop at this time given shared decision making and wanting to avoid embolic stroke.  I think this is reasonable.  She did however stop her metoprolol and perhaps feels a bit better.  Recall that the patient had previously stopped metoprolol and had a recurrence of tachypalpitations which were intolerable and required restarting beta-blockade which is why we have continued her on this all this time.  She has been having a sinus infection recently for the past three weeks which has been making her feel worse, she had no associated fever.  Since she has been improving after her upper respiratory infection it is hard for her to know whether the metoprolol or her resolving URI is contributing to her feeling better.  She has also gained weight recently which is displeasing to her.  Past Medical History:  Diagnosis Date   Atrial flutter    Depression    Thyroid disease     Past Surgical History:  Procedure Laterality Date   A-FLUTTER ABLATION N/A 04/07/2021   Procedure: A-FLUTTER ABLATION;  Surgeon: Marinus Maw, MD;  Location: [facility] INVASIVE CV LAB;  Service: Cardiovascular;  Laterality: N/A;   CARDIOVERSION N/A 01/14/2021   Procedure: CARDIOVERSION;  Surgeon: Parke Poisson, MD;  Location[facility] ENDOSCOPY;  Service: Cardiovascular;  Laterality: N/A;    Current Medications: Current Meds  Medication Sig   acetaminophen (TYLENOL) 500 MG tablet Take 500 mg by mouth every 6 (six) hours as needed for mild pain or headache.   apixaban (ELIQUIS) 5 MG TABS tablet Take 1 tablet (5 mg total) by mouth 2 (two) times daily.   Calcium Carbonate (CALTRATE 600 PO) Take 1 tablet by mouth daily.   cholecalciferol (VITAMIN D3) 25 MCG (1000 UNIT) tablet Take 1,000 Units by mouth daily.   levothyroxine (SYNTHROID) 50 MCG tablet Take 75 mcg by mouth daily before breakfast.   metoprolol succinate (TOPROL XL) 25 MG 24 hr tablet Take 1/2 tablet ( 12.5 mg ) daily   sertraline (ZOLOFT) 50 MG  tablet Take 50 mg by mouth daily.     Allergies:   Patient has no known allergies.   Social History   Tobacco Use   Smoking status: Never   Smokeless tobacco: Never  Vaping Use   Vaping Use: Never used  Substance Use Topics   Alcohol use: Never   Drug use: Never     Family History: The patient's family history includes Stomach cancer in her mother.  ROS:   Please see the history of present illness.    (+) Fatigue All other systems reviewed and are negative.  EKGs/Labs/Other Studies Reviewed:    The following studies were reviewed today:  Monitor  12/2021: Patch Wear Time:  13 days and 22 hours (2023-02-16T17:12:05-499 to 2023-03-02T15:42:30-0500)   Patient had a min HR of 43 bpm, max HR of 190 bpm, and avg HR of 59 bpm. Predominant underlying rhythm was Sinus Rhythm. 5 Ventricular Tachycardia runs occurred, the run with the fastest interval lasting 6 beats with a max rate of  160 bpm, the longest  lasting 7 beats with an avg rate of 139 bpm. 345 Supraventricular Tachycardia runs occurred, the run with the fastest interval lasting 5 beats with a max rate of 190 bpm, the longest lasting 15.4 secs with an avg rate of 102 bpm. Some episodes of  Supraventricular Tachycardia may be possible Atrial Tachycardia with variable block. Atrial Fibrillation occurred (<1% burden), ranging from 69-110 bpm (avg of 88 bpm), the longest lasting 3 mins 13 secs with an avg rate of 88 bpm. Isolated SVEs were  rare (<1.0%), SVE Couplets were rare (<1.0%), and SVE Triplets were rare (<1.0%). Isolated VEs were occasional (1.3%, 15793), VE Couplets were rare (<1.0%, 2402), and VE Triplets were rare (<1.0%, 56).  Echo  07/29/2021:  1. Left ventricular ejection fraction, by estimation, is 60 to 65%. Left  ventricular ejection fraction by 3D volume is 56 %. The left ventricle has  normal function. The left ventricle has no regional wall motion  abnormalities. Left ventricular diastolic   parameters are  consistent with Grade I diastolic dysfunction (impaired  relaxation). The average left ventricular global longitudinal strain is  -21.1 %. The global longitudinal strain is normal.   2. Right ventricular systolic function is normal. The right ventricular  size is normal. There is normal pulmonary artery systolic pressure.   3. The mitral valve is degenerative. No evidence of mitral valve  regurgitation. No evidence of mitral stenosis.   4. The aortic valve is tricuspid. There is mild calcification of the  aortic valve. Aortic valve regurgitation is not visualized. No aortic  stenosis is present.   5. The inferior vena cava is normal in size with greater than 50%  respiratory variability, suggesting right atrial pressure of 3 mmHg.   Comparison(s): A prior study was performed on 12/21/20. 12/21/20 EF 60-65%.   A-Flutter Ablation  04/07/2021: CONCLUSIONS:  1. Isthmus-dependent right atrial flutter upon presentation.  2. Successful radiofrequency ablation of atrial flutter along the cavotricuspid isthmus with complete bidirectional isthmus block achieved.  3. No inducible arrhythmias following ablation.  4. No early apparent complications.    EKG:  The EKG is personally reviewed. 02/23/2023: Sinus rhythm, left atrial enlargement, septal infarct  12/03/2022; Sr, poor R wave progression. 08/03/2022:  Sinus rhythm with poor R wave progression.  01/29/2022:  SR, poor R wave progression  Imaging studies that I have independently reviewed today: N/A  Recent Labs: No results found for requested labs within last 365 days.   Recent Lipid Panel No results found for: "CHOL", "TRIG", "HDL", "CHOLHDL", "VLDL", "LDLCALC", "LDLDIRECT"  Physical Exam:    VS:  BP 138/63   Pulse 67   Ht 5\' 5"  (1.651 m)   Wt 141 lb 9.6 oz (64.2 kg)   SpO2 100%   BMI 23.56 kg/m     Wt Readings from Last 5 Encounters:  02/23/23 141 lb 9.6 oz (64.2 kg)  12/03/22 140 lb 9.6 oz (63.8 kg)  08/03/22 137 lb 12.8 oz (62.5  kg)  01/29/22 139 lb (63 kg)  01/14/22 138 lb 3.2 oz (62.7 kg)    Constitutional: No acute distress Eyes: sclera non-icteric, normal conjunctiva and lids ENMT: normal dentition, moist mucous membranes Cardiovascular: regular rhythm, normal rate, no murmurs. S1 and S2 normal. Radial pulses normal bilaterally. No jugular venous distention.  Respiratory: clear to auscultation bilaterally GI : normal bowel sounds, soft and nontender. No distention.   MSK: extremities warm, well perfused. No edema.  NEURO: grossly nonfocal exam, moves all extremities.  PSYCH: alert and oriented x 3, normal mood and affect.   ASSESSMENT:    1. Atrial fibrillation, unspecified type   2. Typical atrial flutter   3. Fatigue, unspecified type   4. Secondary hypercoagulable state   5. SVT (supraventricular tachycardia)   6. NSVT (nonsustained ventricular tachycardia)   7. PVC's (premature ventricular contractions)   8. PAC (premature atrial contraction)   9. Other specified hypothyroidism   10. Lightheadedness   11. Dizziness        PLAN:    Typical atrial flutter (HCC) - Plan: EKG 12-Lead Secondary hypercoagulable state (HCC) -No recurrence electrically.  Continue Eliquis 5 mg twice daily, no significant bleeding concerns.  Meets threshold for full dose Eliquis by weight and renal function.   -brief episode of afib on prior cardiac monitor, no change required.  Dizziness  Fatigue, unspecified type -did not change while off of metoprolol x 2 nor worsened when she resumed it.  Continue to observe, likely secondary to deconditioning.  Encouraged her to remain active.  -She has had increasing weakness fatigue with dizziness and lightheadedness at times, not directly attributable to medication therapy, may be related to tachycardia and palpitations with SVT.  Therefore in shared decision making we determined that she will stay on low-dose metoprolol with an additional dose if tachycardia persists and blood  pressure is greater than 100/60.  Could consider blood pressure support medications however her blood pressure does at times rise to 150s over 90s and she is symptomatic with fatigue with this.  Labile blood pressure does complicate this management.  I believe the low blood pressures are likely more problematic than slightly elevated blood pressures.  I have asked her to wear compression socks daily while awake and she may liberalize salt intake while hypotensive.   Total time of encounter: 30 minutes total time of encounter, including 20 minutes spent in face-to-face patient care on the date of this encounter. This time includes coordination of care and counseling regarding above mentioned problem list. Remainder of non-face-to-face time involved reviewing chart documents/testing relevant to the patient encounter and documentation in the medical record. I have independently reviewed documentation from referring provider.   Weston Brass, MD, [facility]  [facility]    Shared Decision Making/Informed Consent:       Medication Adjustments/Labs and Tests Ordered: Current medicines are reviewed at length with the patient today.  Concerns regarding medicines are outlined above.   Orders Placed This Encounter  Procedures   EKG 12-Lead   No orders of the defined types were placed in this encounter.  Patient Instructions  Medication Instructions:  No Changes In Medications at this time.  *If you need a refill on your cardiac medications before your next appointment, please call your pharmacy*  Follow-Up: At [facility], you and your health needs are our priority.  As part of our continuing mission to provide you with exceptional heart care, we have created designated Provider Care Teams.  These Care Teams include your primary Cardiologist (physician) and Advanced Practice Providers (APPs -  Physician Assistants and Nurse Practitioners) who all work together to provide you  with the care you need, when you need it.  Your next appointment:   AS SCHEDULED IN AUGUST   Provider:   Parke Poisson, MD   LIBERALIZE SALT INTAKE  PLEASE GET COMPRESSION STOCKINGS- YOU CAN BUY THESE AT [employer] OR ONLINE

## 2023-02-25 ENCOUNTER — Ambulatory Visit: Payer: Medicare Other | Admitting: Internal Medicine

## 2023-02-28 ENCOUNTER — Other Ambulatory Visit: Payer: Self-pay | Admitting: Internal Medicine

## 2023-02-28 DIAGNOSIS — I483 Typical atrial flutter: Secondary | ICD-10-CM

## 2023-03-01 ENCOUNTER — Ambulatory Visit: Payer: Medicare Other | Admitting: Internal Medicine

## 2023-03-02 ENCOUNTER — Telehealth: Payer: Self-pay | Admitting: Internal Medicine

## 2023-03-02 NOTE — Telephone Encounter (Signed)
*  STAT* If patient is at the pharmacy, call can be transferred to refill team.   1. Which medications need to be refilled? (please list name of each medication and dose if known) apixaban (ELIQUIS) 5 MG TABS tablet   2. Which pharmacy/location (including street and city if local pharmacy) is medication to be sent to?  [id number] - [place], [place] - [address]    3. Do they need a 30 day or 90 day supply? 90   Patient is out of this medication.

## 2023-03-02 NOTE — Telephone Encounter (Signed)
Pt last saw Dr Jacques Navy 02/23/23, last labs 11/12/22 Creat 0.91 at [facility] per [facility], age 89, weight 64.2kg, based on specified criteria pt is on appropriate dosage of Eliquis 5mg  BID for afib.  Will refill rx.     Refill from pharmacy in Epic approved x 90 day supply with 1 refill.

## 2023-03-02 NOTE — Telephone Encounter (Signed)
Pt last saw Dr Jacques Navy 02/23/23, last labs 11/12/22 Creat 0.91 at [facility] per [facility], age 89, weight 64.2kg, based on specified criteria pt is on appropriate dosage of Eliquis 5mg  BID for afib.  Will refill rx.

## 2023-04-14 ENCOUNTER — Encounter: Payer: Self-pay | Admitting: Internal Medicine

## 2023-04-14 ENCOUNTER — Telehealth: Payer: Self-pay | Admitting: Internal Medicine

## 2023-04-14 DIAGNOSIS — R42 Dizziness and giddiness: Secondary | ICD-10-CM

## 2023-04-14 DIAGNOSIS — R5383 Other fatigue: Secondary | ICD-10-CM

## 2023-04-14 DIAGNOSIS — I483 Typical atrial flutter: Secondary | ICD-10-CM

## 2023-04-14 NOTE — Telephone Encounter (Signed)
Spoke with pt, aware of dr Lupe Carney recommendations. Order placed for echo.

## 2023-04-14 NOTE — Telephone Encounter (Signed)
  Patient has been having palpitations for a week. Pt is not currently having symptoms.  2. Pt not currently experiencing lightheaded, SOB or chest pain.  3. Yes, Pt does have a history of AFIB.  4. HR=138, BP= 101/72 Last reading of the BP and HR.  5. 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 Sorry for the inconvenience. Please advise.

## 2023-04-14 NOTE — Telephone Encounter (Signed)
Error

## 2023-04-14 NOTE — Telephone Encounter (Signed)
Spoke with pt, she reports one episode of feeling faint and lightheaded like she did previously when she had atrial fib. This was the first time this has happened. It can occur with activity or at rest, and it last only a few seconds. She is taking the metoprolol 12.5 mg once daily in the morning. Her heart rate today is 69 bpm. Aware to continue to monitor and will let dr Jacques Navy know.

## 2023-04-19 ENCOUNTER — Telehealth: Payer: Self-pay | Admitting: Internal Medicine

## 2023-04-19 NOTE — Telephone Encounter (Signed)
Called patient spoke with daughter- on [place]. She states that her mother must have called, but I seen appointment for ECHO was scheduled for 07/08- however, they are stating that she is feeling bad with the AFIB, and they think they need this sooner. I advised I would route to our scheduling team to advise and if we could maybe get the ECHO done quicker. They all thought it was scheduled for today-  however I did not see that was the case.   Thanks!

## 2023-04-19 NOTE — Telephone Encounter (Signed)
Pt would like a callback to discuss her ECHO appt. She wrote down the appt as being today but I informed pt its on 05/10/2023 and she said it wasn't right so she'd like to speak with a nurse about it. Please advise

## 2023-04-21 ENCOUNTER — Encounter (HOSPITAL_COMMUNITY): Payer: Self-pay

## 2023-04-21 ENCOUNTER — Emergency Department (HOSPITAL_COMMUNITY)
Admission: EM | Admit: 2023-04-21 | Discharge: 2023-04-21 | Disposition: A | Discharge status: Home / Self Care | Payer: Medicare Other | Attending: Emergency Medicine | Admitting: Emergency Medicine

## 2023-04-21 ENCOUNTER — Emergency Department (HOSPITAL_COMMUNITY): Payer: Medicare Other

## 2023-04-21 ENCOUNTER — Other Ambulatory Visit: Payer: Self-pay

## 2023-04-21 DIAGNOSIS — N1831 Chronic kidney disease, stage 3a: Secondary | ICD-10-CM | POA: Insufficient documentation

## 2023-04-21 DIAGNOSIS — R0602 Shortness of breath: Secondary | ICD-10-CM | POA: Diagnosis not present

## 2023-04-21 DIAGNOSIS — Z7901 Long term (current) use of anticoagulants: Secondary | ICD-10-CM | POA: Insufficient documentation

## 2023-04-21 DIAGNOSIS — R7989 Other specified abnormal findings of blood chemistry: Secondary | ICD-10-CM | POA: Insufficient documentation

## 2023-04-21 DIAGNOSIS — R42 Dizziness and giddiness: Secondary | ICD-10-CM | POA: Insufficient documentation

## 2023-04-21 DIAGNOSIS — R000 Tachycardia, unspecified: Secondary | ICD-10-CM | POA: Diagnosis not present

## 2023-04-21 DIAGNOSIS — R531 Weakness: Secondary | ICD-10-CM | POA: Diagnosis not present

## 2023-04-21 DIAGNOSIS — Z79890 Hormone replacement therapy (postmenopausal): Secondary | ICD-10-CM | POA: Diagnosis not present

## 2023-04-21 DIAGNOSIS — R29818 Other symptoms and signs involving the nervous system: Secondary | ICD-10-CM | POA: Diagnosis not present

## 2023-04-21 DIAGNOSIS — N183 Chronic kidney disease, stage 3 unspecified: Secondary | ICD-10-CM | POA: Diagnosis not present

## 2023-04-21 DIAGNOSIS — E039 Hypothyroidism, unspecified: Secondary | ICD-10-CM | POA: Insufficient documentation

## 2023-04-21 DIAGNOSIS — R609 Edema, unspecified: Secondary | ICD-10-CM | POA: Diagnosis not present

## 2023-04-21 DIAGNOSIS — I472 Ventricular tachycardia, unspecified: Secondary | ICD-10-CM | POA: Insufficient documentation

## 2023-04-21 DIAGNOSIS — I10 Essential (primary) hypertension: Secondary | ICD-10-CM | POA: Diagnosis not present

## 2023-04-21 DIAGNOSIS — D6869 Other thrombophilia: Secondary | ICD-10-CM | POA: Diagnosis not present

## 2023-04-21 LAB — COMPREHENSIVE METABOLIC PANEL
ALT: 21 U/L (ref 0–44)
AST: 33 U/L (ref 15–41)
Albumin: 4.3 g/dL (ref 3.5–5.0)
Alkaline Phosphatase: 67 U/L (ref 38–126)
Anion gap: 18 — ABNORMAL HIGH (ref 5–15)
BUN: 18 mg/dL (ref 8–23)
CO2: 22 mmol/L (ref 22–32)
Calcium: 10.1 mg/dL (ref 8.9–10.3)
Chloride: 98 mmol/L (ref 98–111)
Creatinine, Ser: 0.98 mg/dL (ref 0.44–1.00)
GFR, Estimated: 54 mL/min — ABNORMAL LOW (ref >60)
Glucose, Bld: 92 mg/dL (ref 70–99)
Potassium: 4.4 mmol/L (ref 3.5–5.1)
Sodium: 138 mmol/L (ref 135–145)
Total Bilirubin: 0.7 mg/dL (ref 0.3–1.2)
Total Protein: 7.6 g/dL (ref 6.5–8.1)

## 2023-04-21 LAB — CBC
HCT: 45 % (ref 36.0–46.0)
Hemoglobin: 14.4 g/dL (ref 12.0–15.0)
MCH: 32.7 pg (ref 26.0–34.0)
MCHC: 32 g/dL (ref 30.0–36.0)
MCV: 102 fL — ABNORMAL HIGH (ref 80.0–100.0)
Platelets: 168 10*3/uL (ref 150–400)
RBC: 4.41 MIL/uL (ref 3.87–5.11)
RDW: 11.6 % (ref 11.5–15.5)
WBC: 6.9 10*3/uL (ref 4.0–10.5)
nRBC: 0 % (ref 0.0–0.2)

## 2023-04-21 LAB — URINALYSIS, ROUTINE W REFLEX MICROSCOPIC
Bilirubin Urine: NEGATIVE
Glucose, UA: NEGATIVE mg/dL
Hgb urine dipstick: NEGATIVE
Ketones, ur: NEGATIVE mg/dL
Leukocytes,Ua: NEGATIVE
Nitrite: NEGATIVE
Protein, ur: NEGATIVE mg/dL
Specific Gravity, Urine: 1.003 — ABNORMAL LOW (ref 1.005–1.030)
pH: 7 (ref 5.0–8.0)

## 2023-04-21 LAB — BRAIN NATRIURETIC PEPTIDE: B Natriuretic Peptide: 265.7 pg/mL — ABNORMAL HIGH (ref 0.0–100.0)

## 2023-04-21 LAB — TROPONIN I (HIGH SENSITIVITY): Troponin I (High Sensitivity): 7 ng/L (ref <18)

## 2023-04-21 MED ORDER — ALBUTEROL SULFATE HFA 108 (90 BASE) MCG/ACT IN AERS: 2.0000 | INHALATION_SPRAY | RESPIRATORY_TRACT | Status: DC | PRN

## 2023-04-21 NOTE — ED Triage Notes (Signed)
Pt BIBGEMS from home for generalized weakness with dyspnea on exertion with edema in the lower extremities. Lungs clear, with movement patient is dizzy x 1 week  80 hr  180/68 116 cbg

## 2023-04-21 NOTE — ED Notes (Signed)
Pt ambulated to restroom with two assist, and reported slight dizziness. Md notified. Vital signs maintained hypertensive.

## 2023-04-21 NOTE — Discharge Instructions (Signed)
Thank you for coming to [facility] Emergency Department. You were seen for dizziness. We did an exam, labs, and imaging including an MRI of your brain, and these showed no acute findings.  Please stay well-hydrated at home.  You can try meclizine 25 mg at home to or 3 times per day for dizziness. Please follow up with your primary care provider and cardiologist within 1-2 weeks.   Do not hesitate to return to the ED or call 911 if you experience: -Worsening symptoms -Strokelike symptoms including slurred speech, confusion, facial droop, numbness tingling, asymmetric weakness -Chest pain, shortness of breath -Lightheadedness, passing out -Fevers/chills -Anything else that concerns you

## 2023-04-21 NOTE — ED Provider Notes (Signed)
[place] EMERGENCY DEPARTMENT AT [facility] Provider Note   CSN: [id number] Arrival date & time: 04/21/23  1446     History {Add pertinent medical, surgical, social history, OB history to HPI:1} Chief Complaint  Patient presents with   Shortness of Breath    Diana Lee is a 89 y.o. female with a flutter on eliquis, Vtach, CKD stage 3, hypothyroidism,  presents BIBGEMS from home for generalized weakness with dizziness, especially with movement. A/w intermittent palpitations that doesn't always happen at the same time. Doesn't feel dizzy right now as she lays here. She denise CP, SOB, DOE, N/V/D/C, urinary symptoms, f/c. Son at bedside states that she lives at home alone, has been doing a lot, like outside watering her plants, wonders if she's over-exerting herself. When she bends over to pick up the watering can and then stands back up, she feels dizzy.  Symptoms started 2 weeks ago. Denies N/T, asymmetric weakness, visual changes, HA, head trauma, falls, slurred speech, confusion. She states that this has happened before with her Afib.  She states she stays well-hydrated at home and does not feel dehydrated.   Shortness of Breath      Home Medications Prior to Admission medications   Medication Sig Start Date End Date Taking? Authorizing Provider  acetaminophen (TYLENOL) 500 MG tablet Take 500 mg by mouth every 6 (six) hours as needed for mild pain or headache.    [provider]  apixaban (ELIQUIS) 5 MG TABS tablet TAKE 1 TABLET BY MOUTH TWICE A DAY 03/02/23   Parke Poisson, MD  Calcium Carbonate (CALTRATE 600 PO) Take 1 tablet by mouth daily.    [provider]  cholecalciferol (VITAMIN D3) 25 MCG (1000 UNIT) tablet Take 1,000 Units by mouth daily.    [provider]  levothyroxine (SYNTHROID) 50 MCG tablet Take 75 mcg by mouth daily before breakfast. 12/24/20   [provider]  metoprolol succinate (TOPROL XL) 25 MG 24 hr tablet  Take 1/2 tablet ( 12.5 mg ) daily 02/22/23   Parke Poisson, MD  sertraline (ZOLOFT) 50 MG tablet Take 50 mg by mouth daily. 12/13/20   [provider]      Allergies    Patient has no known allergies.    Review of Systems   Review of Systems  Respiratory:  Positive for shortness of breath.    A 10 point review of systems was performed and is negative unless otherwise reported in HPI.  Physical Exam Updated Vital Signs BP (!) 169/68   Pulse 65   Temp 98.4 F (36.9 C) (Oral)   Resp 16   Ht 5\' 5"  (1.651 m)   Wt 63.5 kg   SpO2 100%   BMI 23.30 kg/m  Physical Exam General: Normal appearing female, lying in bed.  HEENT: PERRLA, Sclera anicteric, MMM, trachea midline.  Cardiology: RRR, no murmurs/rubs/gallops. BL radial and DP pulses equal bilaterally.  Resp: Normal respiratory rate and effort. CTAB, no wheezes, rhonchi, crackles.  Abd: Soft, non-tender, non-distended. No rebound tenderness or guarding.  GU: Deferred. MSK: No peripheral edema or signs of trauma. Extremities without deformity or TTP. No cyanosis or clubbing. Skin: warm, dry. No rashes or lesions. Back: No CVA tenderness Neuro: A&Ox4, CNs II-XII grossly intact. MAEs. Sensation grossly intact.  Psych: Normal mood and affect.   ED Results / Procedures / Treatments   Labs (all labs ordered are listed, but only abnormal results are displayed) Labs Reviewed  BASIC METABOLIC PANEL  CBC  URINALYSIS, ROUTINE W REFLEX MICROSCOPIC    EKG None  Radiology No results found.  Procedures Procedures  {Document cardiac monitor, telemetry assessment procedure when appropriate:1}  Medications Ordered in ED Medications  albuterol (VENTOLIN HFA) 108 (90 Base) MCG/ACT inhaler 2 puff (has no administration in time range)    ED Course/ Medical Decision Making/ A&P                          Medical Decision Making Amount and/or Complexity of Data Reviewed Labs: ordered. Decision-making details documented  in ED Course. Radiology: ordered.  Risk Prescription drug management.    This patient presents to the ED for concern of dizziness, this involves an extensive number of treatment options, and is a complaint that carries with it a high risk of complications and morbidity.  I considered the following differential and admission for this acute, potentially life threatening condition. HDS, afebrile at this time.   MDM:    Dizziness is broad. Consider afib/flutter but EKG shows NSR at this moment. Consider intermittent arrhythmias, will monitor on telemetry while in ED. could consider outpatient cardiology workup with Holter monitor to determine A-fib burden though she does not have any now and is asymptomatic currently.  Given that her symptoms seem to occur when she is standing from bending over consider orthostatic presyncope or dehydration though patient's labs do not demonstrate overt dehydration or renal injury, and patient states she does drink a lot of water.  No electrolyte abnormalities demonstrated on BMP.  She does not have any dizziness currently, no nystagmus, cannot apply to hints exam.  She feels as though more so that she is going to pass out or lose consciousness.  She has no other focal neurodeficits on exam, does not complain of any other neurologic symptoms, symptoms additionally seem to be positional in nature, I have low concern for central cause of dizziness such as CVA.  Could consider peripheral vertigo such as BPPV.  No hearing changes or recent illnesses to suggest Mnire syndrome or labyrinthitis.  No anemia on CBC, no recent changes to her medications or any sedating meds on her list to consider polypharmacy.  No hypo or hyperglycemia.   Clinical Course as of 04/21/23 1705  Wed Apr 21, 2023  1704 Troponin I (High Sensitivity): 7 Neg [HN]  1705 CBC(!) Unremarkable in the context of this patient's presentation  [HN]  1705 B Natriuretic Peptide(!): 265.7 Elevated, c/w  prior value [HN]    Clinical Course User Index [HN] Loetta Rough, MD    Labs: I Ordered, and personally interpreted labs.  The pertinent results include:  those listed above  Additional history obtained from chart review, son at bedside.    Cardiac Monitoring: The patient was maintained on a cardiac monitor.  I personally viewed and interpreted the cardiac monitored which showed an underlying rhythm of: NSR  Reevaluation: After the interventions noted above, I reevaluated the patient and found that they have :{resolved/improved/worsened:23923::"improved"}  Social Determinants of Health: ***  Disposition:  ***  Co morbidities that complicate the patient evaluation  Past Medical History:  Diagnosis Date   Atrial flutter (HCC)    Depression    Thyroid disease      Medicines Meds ordered this encounter  Medications   albuterol (VENTOLIN HFA) 108 (90 Base) MCG/ACT inhaler 2 puff    I have reviewed the patients home medicines and have made adjustments as needed  Problem List / ED  Course: Problem List Items Addressed This Visit   None        {Document critical care time when appropriate:1} {Document review of labs and clinical decision tools ie heart score, Chads2Vasc2 etc:1}  {Document your independent review of radiology images, and any outside records:1} {Document your discussion with family members, caretakers, and with consultants:1} {Document social determinants of health affecting pt's care:1} {Document your decision making why or why not admission, treatments were needed:1}  This note was created using dictation software, which may contain spelling or grammatical errors.

## 2023-04-21 NOTE — ED Notes (Signed)
Patient transported to MRI 

## 2023-04-22 DIAGNOSIS — R42 Dizziness and giddiness: Secondary | ICD-10-CM | POA: Diagnosis not present

## 2023-04-27 DIAGNOSIS — R42 Dizziness and giddiness: Secondary | ICD-10-CM | POA: Diagnosis not present

## 2023-04-27 DIAGNOSIS — D6869 Other thrombophilia: Secondary | ICD-10-CM | POA: Diagnosis not present

## 2023-04-27 DIAGNOSIS — I4891 Unspecified atrial fibrillation: Secondary | ICD-10-CM | POA: Diagnosis not present

## 2023-04-27 DIAGNOSIS — F322 Major depressive disorder, single episode, severe without psychotic features: Secondary | ICD-10-CM | POA: Diagnosis not present

## 2023-04-30 DIAGNOSIS — E079 Disorder of thyroid, unspecified: Secondary | ICD-10-CM | POA: Diagnosis not present

## 2023-04-30 DIAGNOSIS — N183 Chronic kidney disease, stage 3 unspecified: Secondary | ICD-10-CM | POA: Diagnosis not present

## 2023-04-30 DIAGNOSIS — Z7901 Long term (current) use of anticoagulants: Secondary | ICD-10-CM | POA: Diagnosis not present

## 2023-04-30 DIAGNOSIS — M810 Age-related osteoporosis without current pathological fracture: Secondary | ICD-10-CM | POA: Diagnosis not present

## 2023-04-30 DIAGNOSIS — F322 Major depressive disorder, single episode, severe without psychotic features: Secondary | ICD-10-CM | POA: Diagnosis not present

## 2023-04-30 DIAGNOSIS — E039 Hypothyroidism, unspecified: Secondary | ICD-10-CM | POA: Diagnosis not present

## 2023-04-30 DIAGNOSIS — H9193 Unspecified hearing loss, bilateral: Secondary | ICD-10-CM | POA: Diagnosis not present

## 2023-04-30 DIAGNOSIS — K589 Irritable bowel syndrome without diarrhea: Secondary | ICD-10-CM | POA: Diagnosis not present

## 2023-04-30 DIAGNOSIS — M150 Primary generalized (osteo)arthritis: Secondary | ICD-10-CM | POA: Diagnosis not present

## 2023-04-30 DIAGNOSIS — E063 Autoimmune thyroiditis: Secondary | ICD-10-CM | POA: Diagnosis not present

## 2023-04-30 DIAGNOSIS — Z602 Problems related to living alone: Secondary | ICD-10-CM | POA: Diagnosis not present

## 2023-04-30 DIAGNOSIS — Z556 Problems related to health literacy: Secondary | ICD-10-CM | POA: Diagnosis not present

## 2023-04-30 DIAGNOSIS — I471 Supraventricular tachycardia, unspecified: Secondary | ICD-10-CM | POA: Diagnosis not present

## 2023-04-30 DIAGNOSIS — D6869 Other thrombophilia: Secondary | ICD-10-CM | POA: Diagnosis not present

## 2023-04-30 DIAGNOSIS — I4891 Unspecified atrial fibrillation: Secondary | ICD-10-CM | POA: Diagnosis not present

## 2023-05-03 ENCOUNTER — Ambulatory Visit (HOSPITAL_COMMUNITY)
Admission: RE | Admit: 2023-05-03 | Discharge: 2023-05-03 | Disposition: A | Discharge status: Home / Self Care | Payer: Medicare Other | Source: Ambulatory Visit | Attending: Internal Medicine | Admitting: Internal Medicine

## 2023-05-03 DIAGNOSIS — I081 Rheumatic disorders of both mitral and tricuspid valves: Secondary | ICD-10-CM | POA: Diagnosis not present

## 2023-05-03 DIAGNOSIS — R5383 Other fatigue: Secondary | ICD-10-CM | POA: Diagnosis not present

## 2023-05-03 DIAGNOSIS — R002 Palpitations: Secondary | ICD-10-CM | POA: Insufficient documentation

## 2023-05-03 DIAGNOSIS — R42 Dizziness and giddiness: Secondary | ICD-10-CM

## 2023-05-03 DIAGNOSIS — I483 Typical atrial flutter: Secondary | ICD-10-CM

## 2023-05-03 DIAGNOSIS — I4892 Unspecified atrial flutter: Secondary | ICD-10-CM | POA: Diagnosis not present

## 2023-05-03 LAB — ECHOCARDIOGRAM COMPLETE
Area-P 1/2: 3.12 cm2
Calc EF: 62.7 %
MV M vel: 4.73 m/s
MV Peak grad: 89.3 mmHg
Radius: 0.2 cm
S' Lateral: 2.7 cm
Single Plane A2C EF: 62.1 %
Single Plane A4C EF: 65.5 %

## 2023-05-03 NOTE — Progress Notes (Signed)
Echocardiogram 2D Echocardiogram has been performed.  Augustine Radar 05/03/2023, 3:05 PM

## 2023-05-05 ENCOUNTER — Ambulatory Visit: Payer: Medicare Other | Attending: Internal Medicine | Admitting: Internal Medicine

## 2023-05-05 ENCOUNTER — Ambulatory Visit: Payer: Medicare Other | Admitting: Internal Medicine

## 2023-05-05 ENCOUNTER — Encounter: Payer: Self-pay | Admitting: Internal Medicine

## 2023-05-05 VITALS — BP 124/52 | HR 58 | Ht 66.0 in | Wt 138.0 lb

## 2023-05-05 DIAGNOSIS — R42 Dizziness and giddiness: Secondary | ICD-10-CM | POA: Diagnosis not present

## 2023-05-05 DIAGNOSIS — I483 Typical atrial flutter: Secondary | ICD-10-CM

## 2023-05-05 DIAGNOSIS — I471 Supraventricular tachycardia, unspecified: Secondary | ICD-10-CM

## 2023-05-05 DIAGNOSIS — D6869 Other thrombophilia: Secondary | ICD-10-CM

## 2023-05-05 DIAGNOSIS — I4891 Unspecified atrial fibrillation: Secondary | ICD-10-CM

## 2023-05-05 DIAGNOSIS — R5383 Other fatigue: Secondary | ICD-10-CM | POA: Diagnosis not present

## 2023-05-05 DIAGNOSIS — I4729 Other ventricular tachycardia: Secondary | ICD-10-CM

## 2023-05-05 DIAGNOSIS — I493 Ventricular premature depolarization: Secondary | ICD-10-CM

## 2023-05-05 NOTE — Patient Instructions (Signed)
Medication Instructions:  Your physician recommends that you continue on your current medications as directed. Please refer to the Current Medication list given to you today.  *If you need a refill on your cardiac medications before your next appointment, please call your pharmacy*   Lab Work: NONE If you have labs (blood work) drawn today and your tests are completely normal, you will receive your results only by: MyChart Message (if you have MyChart) OR A paper copy in the mail If you have any lab test that is abnormal or we need to change your treatment, we will call you to review the results.   Testing/Procedures: NONE   Follow-Up: At [facility], you and your health needs are our priority.  As part of our continuing mission to provide you with exceptional heart care, we have created designated Provider Care Teams.  These Care Teams include your primary Cardiologist (physician) and Advanced Practice Providers (APPs -  Physician Assistants and Nurse Practitioners) who all work together to provide you with the care you need, when you need it.  Your next appointment:   6 month(s)  Provider:   Parke Poisson, MD

## 2023-05-05 NOTE — Progress Notes (Signed)
Cardiology Office Note:    Date:  05/05/2023   ID:  Diana Lee, DOB June 01, 1929, MRN [id number]  PCP:  [name], MD  Cardiologist:  Parke Poisson, MD  Electrophysiologist:  Lewayne Bunting, MD   Referring MD: [name], MD   Chief Complaint/Reason for Referral: Atrial flutter  History of Present Illness:    Diana Lee is a 89 y.o. female with a history of hypothyroidism, depression, and recent episodes of atrial flutter who presents for follow-up.  She underwent EP study and catheter ablation of atrial flutter on 04/08/2021 with Dr. Lewayne Bunting.  They participated in shared decision making and elected to continue apixaban 5 mg twice daily for stroke risk reduction.  05/05/23: We made an acute care visit today for worsening fatigue and some dizziness.  She has been seen in the ER with reassuring testing in mid June and then subsequently saw her primary care physician who felt dizziness was overall chronic and not representative of a new concern.  She was given some Dramamine for dizziness which the patient states is helping somewhat.  She remains well-hydrated and does not feel that dehydration is contributing to current issues.  She does maybe think that she was pushing herself too hard in the heat which has been extreme lately.  We updated her echocardiogram which was very reassuring with normal systolic and diastolic function and no significant valvular heart disease.  EKG obtained in the ER demonstrated sinus rhythm.  Patient has low burden of A-fib on the monitor we performed last year.  In the ER she did have an MCV of 102 and I have encouraged her and her daughter to address this with Dr. Clelia Croft her primary doctor in case this may be representative of B12 deficiency which is outside the scope of my practice to diagnose and treat.  02/23/23:  She made an acute care visit for symptoms of dizziness and lightheadedness that coincide with tachypalpitations and heart rates in the 140s.   She does at this time have blood pressures that are low and symptomatic around 100s over 60s.  Her blood pressure however has been labile and at times will be up to 157/98.  After our visit in February she took a brief hiatus from her metoprolol but experienced 1 of these episodes of tachycardia and was instructed to resume her metoprolol.  She did so and while she was off metoprolol noticed no significant difference in her fatigue.  She resume metoprolol, but approximately once a week will have an episode of feeling weak, and again the blood pressure is low at this time.  It does not always coincide with tachycardia or palpitations.  She is in sinus rhythm on EKG today.  Her daughter with her today notes that she is slowing down and feels she is declining, but overall she feels better today than she has in the last several days.  She is looking forward to summer with warm weather to be able to get outside which is when she feels the best.  We discussed compression stockings and liberalizing salt intake for the benefit of hypotension that is symptomatic.  We discussed wearing a cardiac monitor but in shared decision making we feel that these episodes are already defined and she is already covered with a blood thinner and rate control agents.  Prior visit: At her 01/29/2022 appointment she was concerned about her fatigue which had persisted since her initial visit in February. Her fatigue was stable, but did not improve while  off of metoprolol.  We discussed that this may be a combination of deconditioning and multiple procedures which have caused her to lose some of her strength. She had a cardiac monitor placed to evaluate for afib which showed NSVT, PACs, PVCs, SVT, and was seen by EP. Not felt that these episodes contributed to fatigue. Recommended to resume metoprolol. No change in symptoms in prior two weeks. We planned to continue observation of her symptoms, likely secondary to deconditioning. Last visit  she denied any SOB and her fatigue seemed more controllable, and she had only one episode of palpitations in the previous month.  Today, the patient presents with her son to help provide history. He states that her blood pressure has been variable lately. She was stressed at christmas having to cook for her whole family, and has been extra fatigued since. She had to take frequent breaks while cooking to rest due to her fatigue.  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  This speaks volumes about her functional capacity.  She believes her fatigue to be related to having low blood pressure for the past few months. However in clinic today it was measured at 155/67 on recheck it was 134/72.  She had been taking Metoprolol  with no change in symptoms when discontinuing. She also recently discussed discontinuing Eliquis given unresolved fatigue.  We have participated in shared decision making again given possibility of recurrent or silent atrial arrhythmia which could contribute to stroke risk.  She has not stopped Eliquis and will not stop at this time given shared decision making and wanting to avoid embolic stroke.  I think this is reasonable.  She did however stop her metoprolol and perhaps feels a bit better.  Recall that the patient had previously stopped metoprolol and had a recurrence of tachypalpitations which were intolerable and required restarting beta-blockade which is why we have continued her on this all this time.  She has been having a sinus infection recently for the past three weeks which has been making her feel worse, she had no associated fever.  Since she has been improving after her upper respiratory infection it is hard for her to know whether the metoprolol or her resolving URI is contributing to her feeling better.  She has also gained weight recently which is displeasing to her.  Past Medical History:  Diagnosis Date    Atrial flutter (HCC)    Depression    Thyroid disease     Past Surgical History:  Procedure Laterality Date   A-FLUTTER ABLATION N/A 04/07/2021   Procedure: A-FLUTTER ABLATION;  Surgeon: Marinus Maw, MD;  Location: [facility] INVASIVE CV LAB;  Service: Cardiovascular;  Laterality: N/A;   CARDIOVERSION N/A 01/14/2021   Procedure: CARDIOVERSION;  Surgeon: Parke Poisson, MD;  Location: [facility] ENDOSCOPY;  Service: Cardiovascular;  Laterality: N/A;    Current Medications: Current Meds  Medication Sig   acetaminophen (TYLENOL) 500 MG tablet Take 500 mg by mouth every 6 (six) hours as needed for mild pain or headache.   apixaban (ELIQUIS) 5 MG TABS tablet TAKE 1 TABLET BY MOUTH TWICE A DAY   Calcium Carbonate (CALTRATE 600 PO) Take 1 tablet by mouth daily.   cholecalciferol (VITAMIN D3) 25 MCG (1000 UNIT) tablet Take 1,000 Units by mouth daily.   levothyroxine (SYNTHROID) 50 MCG tablet Take 75 mcg by mouth daily before breakfast.   metoprolol succinate (TOPROL XL) 25 MG 24  hr tablet Take 1/2 tablet ( 12.5 mg ) daily   sertraline (ZOLOFT) 50 MG tablet Take 50 mg by mouth daily.     Allergies:   Patient has no known allergies.   Social History   Tobacco Use   Smoking status: Never   Smokeless tobacco: Never  Vaping Use   Vaping Use: Never used  Substance Use Topics   Alcohol use: Never   Drug use: Never     Family History: The patient's family history includes Stomach cancer in her mother.  ROS:   Please see the history of present illness.     All other systems reviewed and are negative.  EKGs/Labs/Other Studies Reviewed:    The following studies were reviewed today:  Monitor  12/2021: Patch Wear Time:  13 days and 22 hours (2023-02-16T17:12:05-499 to 2023-03-02T15:42:30-0500)   Patient had a min HR of 43 bpm, max HR of 190 bpm, and avg HR of 59 bpm. Predominant underlying rhythm was Sinus Rhythm. 5 Ventricular Tachycardia runs occurred, the run with the fastest interval  lasting 6 beats with a max rate of 160 bpm, the longest  lasting 7 beats with an avg rate of 139 bpm. 345 Supraventricular Tachycardia runs occurred, the run with the fastest interval lasting 5 beats with a max rate of 190 bpm, the longest lasting 15.4 secs with an avg rate of 102 bpm. Some episodes of  Supraventricular Tachycardia may be possible Atrial Tachycardia with variable block. Atrial Fibrillation occurred (<1% burden), ranging from 69-110 bpm (avg of 88 bpm), the longest lasting 3 mins 13 secs with an avg rate of 88 bpm. Isolated SVEs were  rare (<1.0%), SVE Couplets were rare (<1.0%), and SVE Triplets were rare (<1.0%). Isolated VEs were occasional (1.3%, 15793), VE Couplets were rare (<1.0%, 2402), and VE Triplets were rare (<1.0%, 56).  Echo  07/29/2021:  1. Left ventricular ejection fraction, by estimation, is 60 to 65%. Left  ventricular ejection fraction by 3D volume is 56 %. The left ventricle has  normal function. The left ventricle has no regional wall motion  abnormalities. Left ventricular diastolic   parameters are consistent with Grade I diastolic dysfunction (impaired  relaxation). The average left ventricular global longitudinal strain is  -21.1 %. The global longitudinal strain is normal.   2. Right ventricular systolic function is normal. The right ventricular  size is normal. There is normal pulmonary artery systolic pressure.   3. The mitral valve is degenerative. No evidence of mitral valve  regurgitation. No evidence of mitral stenosis.   4. The aortic valve is tricuspid. There is mild calcification of the  aortic valve. Aortic valve regurgitation is not visualized. No aortic  stenosis is present.   5. The inferior vena cava is normal in size with greater than 50%  respiratory variability, suggesting right atrial pressure of 3 mmHg.   Comparison(s): A prior study was performed on 12/21/20. 12/21/20 EF 60-65%.   A-Flutter Ablation  04/07/2021: CONCLUSIONS:  1.  Isthmus-dependent right atrial flutter upon presentation.  2. Successful radiofrequency ablation of atrial flutter along the cavotricuspid isthmus with complete bidirectional isthmus block achieved.  3. No inducible arrhythmias following ablation.  4. No early apparent complications.    EKG:  The EKG is personally reviewed. EKG not performed today. 02/23/2023: Sinus rhythm, left atrial enlargement, septal infarct  12/03/2022; Sr, poor R wave progression. 08/03/2022:  Sinus rhythm with poor R wave progression.  01/29/2022:  SR, poor R wave progression  Imaging studies that I  have independently reviewed today: N/A  Recent Labs: 04/21/2023: ALT 21; B Natriuretic Peptide 265.7; BUN 18; Creatinine, Ser 0.98; Hemoglobin 14.4; Platelets 168; Potassium 4.4; Sodium 138   Recent Lipid Panel No results found for: "CHOL", "TRIG", "HDL", "CHOLHDL", "VLDL", "LDLCALC", "LDLDIRECT"  Physical Exam:    VS:  BP (!) 124/52   Pulse (!) 58   Ht 5\' 6"  (1.676 m)   Wt 138 lb (62.6 kg)   SpO2 95%   BMI 22.27 kg/m     Wt Readings from Last 5 Encounters:  05/05/23 138 lb (62.6 kg)  04/21/23 140 lb (63.5 kg)  02/23/23 141 lb 9.6 oz (64.2 kg)  12/03/22 140 lb 9.6 oz (63.8 kg)  08/03/22 137 lb 12.8 oz (62.5 kg)    Constitutional: No acute distress Eyes: sclera non-icteric, normal conjunctiva and lids ENMT: normal dentition, moist mucous membranes Cardiovascular: regular rhythm, normal rate, no murmurs. S1 and S2 normal. Radial pulses normal bilaterally. No jugular venous distention.  Respiratory: clear to auscultation bilaterally GI : normal bowel sounds, soft and nontender. No distention.   MSK: extremities warm, well perfused. No edema.  NEURO: grossly nonfocal exam, moves all extremities. PSYCH: alert and oriented x 3, normal mood and affect.   ASSESSMENT:    1. Typical atrial flutter (HCC)   2. Fatigue, unspecified type   3. Lightheadedness   4. Atrial fibrillation, unspecified type (HCC)   5.  Secondary hypercoagulable state (HCC)   6. SVT (supraventricular tachycardia)   7. NSVT (nonsustained ventricular tachycardia) (HCC)   8. PVC's (premature ventricular contractions)   9. Dizziness    PLAN:    Typical atrial flutter (HCC) - Plan: EKG 12-Lead Secondary hypercoagulable state (HCC) -No recurrence electrically.  Continue Eliquis 5 mg twice daily, no significant bleeding concerns.  Meets threshold for full dose Eliquis by weight and renal function.   -brief episode of afib on prior cardiac monitor, no change required.  Dizziness  Fatigue, unspecified type -did not change while off of metoprolol x 2 nor worsened when she resumed it.  Continue to observe, likely secondary to deconditioning.  Encouraged her to remain active.  -She has had increasing weakness fatigue with dizziness and lightheadedness at times, not directly attributable to medication therapy, may be related to tachycardia and palpitations with SVT.  Therefore in shared decision making we determined that she will stay on low-dose metoprolol with an additional dose if tachycardia persists and blood pressure is greater than 100/60.  Could consider blood pressure support medications however her blood pressure does at times rise to 150s over 90s and she is symptomatic with fatigue with this.  Labile blood pressure does complicate this management.  I believe the low blood pressures are likely more problematic than slightly elevated blood pressures.  I have asked her to wear compression socks daily while awake and she may liberalize salt intake while hypotensive. -Compression socks have helped somewhat, and her primary physician is placed her on Dramamine as needed for dizziness. -Echocardiogram unremarkable, would recommend continuing current therapy, cardiovascular status appears stable.   Total time of encounter: 30 minutes total time of encounter, including 20 minutes spent in face-to-face patient care on the date of this  encounter. This time includes coordination of care and counseling regarding above mentioned problem list. Remainder of non-face-to-face time involved reviewing chart documents/testing relevant to the patient encounter and documentation in the medical record. I have independently reviewed documentation from referring provider.   Weston Brass, MD, [facility]  [facility]  Heart[facility]    Shared Decision Making/Informed Consent:       Medication Adjustments/Labs and Tests Ordered: Current medicines are reviewed at length with the patient today.  Concerns regarding medicines are outlined above.   No orders of the defined types were placed in this encounter.  No orders of the defined types were placed in this encounter.  Patient Instructions  Medication Instructions:  Your physician recommends that you continue on your current medications as directed. Please refer to the Current Medication list given to you today.  *If you need a refill on your cardiac medications before your next appointment, please call your pharmacy*   Lab Work: NONE If you have labs (blood work) drawn today and your tests are completely normal, you will receive your results only by: MyChart Message (if you have MyChart) OR A paper copy in the mail If you have any lab test that is abnormal or we need to change your treatment, we will call you to review the results.   Testing/Procedures: NONE   Follow-Up: At [facility], you and your health needs are our priority.  As part of our continuing mission to provide you with exceptional heart care, we have created designated Provider Care Teams.  These Care Teams include your primary Cardiologist (physician) and Advanced Practice Providers (APPs -  Physician Assistants and Nurse Practitioners) who all work together to provide you with the care you need, when you need it.  Your next appointment:   6 month(s)  Provider:   Parke Poisson, MD

## 2023-05-10 ENCOUNTER — Other Ambulatory Visit (HOSPITAL_COMMUNITY): Payer: Medicare Other

## 2023-05-12 DIAGNOSIS — K589 Irritable bowel syndrome without diarrhea: Secondary | ICD-10-CM | POA: Diagnosis not present

## 2023-05-12 DIAGNOSIS — Z556 Problems related to health literacy: Secondary | ICD-10-CM | POA: Diagnosis not present

## 2023-05-12 DIAGNOSIS — I4891 Unspecified atrial fibrillation: Secondary | ICD-10-CM | POA: Diagnosis not present

## 2023-05-12 DIAGNOSIS — E079 Disorder of thyroid, unspecified: Secondary | ICD-10-CM | POA: Diagnosis not present

## 2023-05-12 DIAGNOSIS — M810 Age-related osteoporosis without current pathological fracture: Secondary | ICD-10-CM | POA: Diagnosis not present

## 2023-05-12 DIAGNOSIS — N183 Chronic kidney disease, stage 3 unspecified: Secondary | ICD-10-CM | POA: Diagnosis not present

## 2023-05-12 DIAGNOSIS — Z602 Problems related to living alone: Secondary | ICD-10-CM | POA: Diagnosis not present

## 2023-05-12 DIAGNOSIS — D6869 Other thrombophilia: Secondary | ICD-10-CM | POA: Diagnosis not present

## 2023-05-12 DIAGNOSIS — I471 Supraventricular tachycardia, unspecified: Secondary | ICD-10-CM | POA: Diagnosis not present

## 2023-05-12 DIAGNOSIS — F322 Major depressive disorder, single episode, severe without psychotic features: Secondary | ICD-10-CM | POA: Diagnosis not present

## 2023-05-12 DIAGNOSIS — M150 Primary generalized (osteo)arthritis: Secondary | ICD-10-CM | POA: Diagnosis not present

## 2023-05-12 DIAGNOSIS — H9193 Unspecified hearing loss, bilateral: Secondary | ICD-10-CM | POA: Diagnosis not present

## 2023-05-12 DIAGNOSIS — E063 Autoimmune thyroiditis: Secondary | ICD-10-CM | POA: Diagnosis not present

## 2023-05-12 DIAGNOSIS — E039 Hypothyroidism, unspecified: Secondary | ICD-10-CM | POA: Diagnosis not present

## 2023-05-12 DIAGNOSIS — Z7901 Long term (current) use of anticoagulants: Secondary | ICD-10-CM | POA: Diagnosis not present

## 2023-05-14 DIAGNOSIS — D6869 Other thrombophilia: Secondary | ICD-10-CM | POA: Diagnosis not present

## 2023-05-14 DIAGNOSIS — H9193 Unspecified hearing loss, bilateral: Secondary | ICD-10-CM | POA: Diagnosis not present

## 2023-05-14 DIAGNOSIS — F322 Major depressive disorder, single episode, severe without psychotic features: Secondary | ICD-10-CM | POA: Diagnosis not present

## 2023-05-14 DIAGNOSIS — Z602 Problems related to living alone: Secondary | ICD-10-CM | POA: Diagnosis not present

## 2023-05-14 DIAGNOSIS — Z7901 Long term (current) use of anticoagulants: Secondary | ICD-10-CM | POA: Diagnosis not present

## 2023-05-14 DIAGNOSIS — K589 Irritable bowel syndrome without diarrhea: Secondary | ICD-10-CM | POA: Diagnosis not present

## 2023-05-14 DIAGNOSIS — I471 Supraventricular tachycardia, unspecified: Secondary | ICD-10-CM | POA: Diagnosis not present

## 2023-05-14 DIAGNOSIS — E039 Hypothyroidism, unspecified: Secondary | ICD-10-CM | POA: Diagnosis not present

## 2023-05-14 DIAGNOSIS — Z556 Problems related to health literacy: Secondary | ICD-10-CM | POA: Diagnosis not present

## 2023-05-14 DIAGNOSIS — E063 Autoimmune thyroiditis: Secondary | ICD-10-CM | POA: Diagnosis not present

## 2023-05-14 DIAGNOSIS — E079 Disorder of thyroid, unspecified: Secondary | ICD-10-CM | POA: Diagnosis not present

## 2023-05-14 DIAGNOSIS — M810 Age-related osteoporosis without current pathological fracture: Secondary | ICD-10-CM | POA: Diagnosis not present

## 2023-05-14 DIAGNOSIS — M150 Primary generalized (osteo)arthritis: Secondary | ICD-10-CM | POA: Diagnosis not present

## 2023-05-14 DIAGNOSIS — I4891 Unspecified atrial fibrillation: Secondary | ICD-10-CM | POA: Diagnosis not present

## 2023-05-14 DIAGNOSIS — N183 Chronic kidney disease, stage 3 unspecified: Secondary | ICD-10-CM | POA: Diagnosis not present

## 2023-05-28 DIAGNOSIS — M810 Age-related osteoporosis without current pathological fracture: Secondary | ICD-10-CM | POA: Diagnosis not present

## 2023-05-28 DIAGNOSIS — N183 Chronic kidney disease, stage 3 unspecified: Secondary | ICD-10-CM | POA: Diagnosis not present

## 2023-05-28 DIAGNOSIS — H9193 Unspecified hearing loss, bilateral: Secondary | ICD-10-CM | POA: Diagnosis not present

## 2023-05-28 DIAGNOSIS — Z602 Problems related to living alone: Secondary | ICD-10-CM | POA: Diagnosis not present

## 2023-05-28 DIAGNOSIS — E063 Autoimmune thyroiditis: Secondary | ICD-10-CM | POA: Diagnosis not present

## 2023-05-28 DIAGNOSIS — E079 Disorder of thyroid, unspecified: Secondary | ICD-10-CM | POA: Diagnosis not present

## 2023-05-28 DIAGNOSIS — M150 Primary generalized (osteo)arthritis: Secondary | ICD-10-CM | POA: Diagnosis not present

## 2023-05-28 DIAGNOSIS — K589 Irritable bowel syndrome without diarrhea: Secondary | ICD-10-CM | POA: Diagnosis not present

## 2023-05-28 DIAGNOSIS — Z7901 Long term (current) use of anticoagulants: Secondary | ICD-10-CM | POA: Diagnosis not present

## 2023-05-28 DIAGNOSIS — Z556 Problems related to health literacy: Secondary | ICD-10-CM | POA: Diagnosis not present

## 2023-05-28 DIAGNOSIS — I471 Supraventricular tachycardia, unspecified: Secondary | ICD-10-CM | POA: Diagnosis not present

## 2023-05-28 DIAGNOSIS — D6869 Other thrombophilia: Secondary | ICD-10-CM | POA: Diagnosis not present

## 2023-05-28 DIAGNOSIS — F322 Major depressive disorder, single episode, severe without psychotic features: Secondary | ICD-10-CM | POA: Diagnosis not present

## 2023-05-28 DIAGNOSIS — I4891 Unspecified atrial fibrillation: Secondary | ICD-10-CM | POA: Diagnosis not present

## 2023-05-28 DIAGNOSIS — E039 Hypothyroidism, unspecified: Secondary | ICD-10-CM | POA: Diagnosis not present

## 2023-06-04 ENCOUNTER — Telehealth: Payer: Self-pay | Admitting: Internal Medicine

## 2023-06-04 NOTE — Telephone Encounter (Signed)
STAT if HR is under 50 or over 120 (normal HR is 60-100 beats per minute)  What is your heart rate?  131 (today  Do you have a log of your heart rate readings (document readings)?   This morning 97/65, HR 131 (around 10:00 am) 8/1, 8:00pm - 107/68  HR 131 7/31, mid-day - 117/79  HR 62 7/30 - 114/67 HR 57 7/29 - 113/59, HR 55  Do you have any other symptoms? Fatigue, dizziness  Daughter stated patient's HR has been over 130.

## 2023-06-04 NOTE — Telephone Encounter (Signed)
Call back to patient daughter and she is at patient home.  She has done Vagal maneuver which she states helped.  Takes vitals  and BP 91/63  HR 110. States feeling better and "doing OK"  Advised at this time based on her reply and her vitals, fell dehydration may be playing a part.  Advised  to hydrate over the next few hours and if does not see improvement then call back to the office.  She states understanding

## 2023-06-04 NOTE — Telephone Encounter (Signed)
Patient's daughter is calling stating that the patient's BP at 2:15 PM today was 137/109 HR 93. Please advise.

## 2023-06-04 NOTE — Telephone Encounter (Signed)
Patient daughter states patient HR is at 130 range all day since (10 am )  No SOB, nor chest pain.  Complaint of dizziness and fatigue.  Patient did more activity yesterday since feeling better. Advised Vagal maneuver.  She will go to patients house now to try.  If nor resolution, she will go to ED to try and get resolved

## 2023-06-04 NOTE — Telephone Encounter (Signed)
Pt daughter states the pt/s BP at 2:15pm today was 137/109 with HR of 93.

## 2023-06-08 NOTE — Telephone Encounter (Signed)
Left message for patient's daughter to return the call.

## 2023-06-09 NOTE — Telephone Encounter (Signed)
Call to daughter who states "she is doing much better "  Yesterday 103/64  HR 65.  States the day before "was just about identical".  She states doing OK, just fatigued and not feeling well. She now has a large carry cup that she is keeping full of water for patient to drink on.  She will keep an idea on her BP and HR and will call if any issues or if needs sooner appointment

## 2023-06-22 ENCOUNTER — Ambulatory Visit: Payer: Medicare Other | Admitting: Internal Medicine

## 2023-06-29 DIAGNOSIS — R42 Dizziness and giddiness: Secondary | ICD-10-CM | POA: Diagnosis not present

## 2023-06-29 DIAGNOSIS — I4891 Unspecified atrial fibrillation: Secondary | ICD-10-CM | POA: Diagnosis not present

## 2023-06-29 DIAGNOSIS — D6869 Other thrombophilia: Secondary | ICD-10-CM | POA: Diagnosis not present

## 2023-06-29 DIAGNOSIS — E039 Hypothyroidism, unspecified: Secondary | ICD-10-CM | POA: Diagnosis not present

## 2023-06-29 DIAGNOSIS — F339 Major depressive disorder, recurrent, unspecified: Secondary | ICD-10-CM | POA: Diagnosis not present

## 2023-08-04 DIAGNOSIS — H524 Presbyopia: Secondary | ICD-10-CM | POA: Diagnosis not present

## 2023-09-02 ENCOUNTER — Other Ambulatory Visit: Payer: Self-pay | Admitting: Internal Medicine

## 2023-09-02 DIAGNOSIS — I483 Typical atrial flutter: Secondary | ICD-10-CM

## 2023-09-03 ENCOUNTER — Telehealth: Payer: Self-pay | Admitting: Internal Medicine

## 2023-09-03 DIAGNOSIS — I483 Typical atrial flutter: Secondary | ICD-10-CM

## 2023-09-03 MED ORDER — APIXABAN 5 MG PO TABS
5.0000 mg | ORAL_TABLET | Freq: Two times a day (BID) | ORAL | 1 refills | Status: AC
Start: 2023-09-03 — End: ?

## 2023-09-03 NOTE — Telephone Encounter (Signed)
*  STAT* If patient is at the pharmacy, call can be transferred to refill team.   1. Which medications need to be refilled? (please list name of each medication and dose if known) apixaban (ELIQUIS) 5 MG TABS tablet    4. Which pharmacy/location (including street and city if local pharmacy) is medication to be sent to? [id number] - [place],  - [address]     5. Do they need a 30 day or 90 day supply? 90    Pt daughter states pt is completely out

## 2023-09-03 NOTE — Telephone Encounter (Signed)
Prescription refill request for Eliquis received. Indication: Aflutter Last office visit: 05/05/23 Diana Lee)  Scr: 0.98 (04/21/23)  Age: 89 Weight: 62.6kg  Appropriate dose. Refill sent.

## 2023-09-11 ENCOUNTER — Telehealth: Payer: Self-pay | Admitting: Physician Assistant

## 2023-09-11 NOTE — Telephone Encounter (Signed)
Pt daughter called because BP 95/69 w/ HR 96 this am.   Pt cannot tell when she is in Afib, just feels weak.   Recently has been feeling weak.  Discussed options with her daughter, the patient has expressed a desire definitely not to go to the ER.  Patient and her daughter are aware that it is a balancing act between her blood pressure and her heart rate to try to get her A-fib controlled.  Recommended that she try cutting the metoprolol in half, and take a half a pill in the morning if her blood pressures above 100 and take another half pill in the evening if her blood pressure is above 100.  Will route this message to triage and Dr. Charlynne Pander so they can give her a call on Monday and see how she is doing.  Theodore Demark, PA-C 09/11/2023 11:30 AM

## 2023-09-13 ENCOUNTER — Encounter: Payer: Self-pay | Admitting: Cardiology

## 2023-09-13 ENCOUNTER — Ambulatory Visit: Payer: Medicare Other | Attending: Cardiology | Admitting: Cardiology

## 2023-09-13 VITALS — BP 134/70 | HR 153 | Ht 65.0 in | Wt 140.4 lb

## 2023-09-13 DIAGNOSIS — R42 Dizziness and giddiness: Secondary | ICD-10-CM | POA: Diagnosis not present

## 2023-09-13 DIAGNOSIS — Z79899 Other long term (current) drug therapy: Secondary | ICD-10-CM

## 2023-09-13 DIAGNOSIS — I4891 Unspecified atrial fibrillation: Secondary | ICD-10-CM

## 2023-09-13 DIAGNOSIS — D6869 Other thrombophilia: Secondary | ICD-10-CM

## 2023-09-13 DIAGNOSIS — R5383 Other fatigue: Secondary | ICD-10-CM

## 2023-09-13 DIAGNOSIS — E038 Other specified hypothyroidism: Secondary | ICD-10-CM

## 2023-09-13 DIAGNOSIS — I483 Typical atrial flutter: Secondary | ICD-10-CM

## 2023-09-13 MED ORDER — AMIODARONE HCL 200 MG PO TABS: ORAL_TABLET | ORAL | 3 refills | Status: DC

## 2023-09-13 MED ORDER — METOPROLOL SUCCINATE ER 25 MG PO TB24: 25.0000 mg | ORAL_TABLET | Freq: Every day | ORAL | 3 refills | Status: AC

## 2023-09-13 NOTE — Patient Instructions (Addendum)
Medication Instructions:  INCREASE METOPROLOL SUCCINATE (TOPROL XL) TO 25 MG  AMIODARONE: TAKE 400 mg twice a day for one week.  Then take 200 mg twice a day for two weeks.  Then take 200 mg daily. *If you need a refill on your cardiac medications before your next appointment, please call your pharmacy*   Lab Work: CBC, CMP, TSH, Mag- today If you have labs (blood work) drawn today and your tests are completely normal, you will receive your results only by: MyChart Message (if you have MyChart) OR A paper copy in the mail If you have any lab test that is abnormal or we need to change your treatment, we will call you to review the results.   Follow-Up: At [facility], you and your health needs are our priority.  As part of our continuing mission to provide you with exceptional heart care, we have created designated Provider Care Teams.  These Care Teams include your primary Cardiologist (physician) and Advanced Practice Providers (APPs -  Physician Assistants and Nurse Practitioners) who all work together to provide you with the care you need, when you need it.  We recommend signing up for the patient portal called "MyChart".  Sign up information is provided on this After Visit Summary.  MyChart is used to connect with patients for Virtual Visits (Telemedicine).  Patients are able to view lab/test results, encounter notes, upcoming appointments, etc.  Non-urgent messages can be sent to your provider as well.   To learn more about what you can do with MyChart, go to [email or web address].    Your next appointment:   Friday 09/17/23 at 2:45 with Edd Fabian, NP

## 2023-09-13 NOTE — H&P (View-Only) (Signed)
Cardiology Office Note    Date:  09/13/2023  ID:  Diana Lee, DOB [birth year], MRN [id number] PCP:  [name], MD  Cardiologist:  Parke Poisson, MD  Electrophysiologist:  Lewayne Bunting, MD   Chief Complaint: Hypotension and bradycardia   History of Present Illness: .    Diana Lee is a 89 y.o. female with visit-pertinent history of hypothyroidism, depression, atrial flutter.  She underwent EP study and catheter ablation of atrial fibrillation on 04/08/2021 with Dr. Ladona Ridgel.  She has over the years noted concern with fatigue and some positional dizziness.  In 12/2021 she wore a cardiac monitor for 13 days that showed an average heart rate of 59 bpm, ranged from 43 bpm to 190 bpm.  Predominant underlying rhythm was sinus rhythm.  She had 5 runs of ventricular tachycardia, the run with the fastest interval lasting 6 beats with a max rate of 160 bpm, the longest lasting 7 beats with an average rate of 139 bpm.  She had 345 supraventricular tachycardia runs, the run with the fastest interval lasting 5 beats with a max rate of 190 bpm.  She had a less than 1% burden of atrial fibrillation ranging from 69 to 110 bpm.  Also reviewed by EP, it was felt these episodes would not contribute to her fatigue.  Recommended that she resume metoprolol.  It was felt that her fatigue and dizziness was likely secondary to deconditioning.  At visit on 12/03/2022 she presented with her son, he remarks that her blood pressure had been labile.  Patient felt that her fatigue was likely related to having low blood pressure, in clinic initial reading was 155/67 on recheck was 134/72.  On 02/23/2023 she presented for an acute care visit for dizziness, lightheadedness with tachypalpitations and heart rates in the 140s.  Her blood pressure was low at 100/60.  Given known cause of tachypalpitations and being on a blood thinner through shared decision making a repeat cardiac monitor was deferred.  It was determined that she  would stay on low-dose metoprolol with an additional dose if tachycardia persist and blood pressures greater than 100/60.  Did that blood pressure support medications could be considered however her blood pressure does occasionally rise to 150s over 90s and she is symptomatic with fatigue with this.  It was recommended that she wear compression socks daily and liberalize salt intake while hypotensive.  Echocardiogram on 05/03/2023 indicated LVEF of 60 to 65%, LV with normal function, no RWMA, diastolic parameters were normal, she had normal pulmonary artery systolic pressure.  RA was mildly dilated.  She had mild mitral valve regurgitation, mild to moderate tricuspid valve regurgitation.  She was last seen in clinic on 05/05/2023 for worsening fatigue and dizziness.  She was seen in the ER with reassuring testing in mid June and then subsequently saw her primary care physician who felt dizziness with an overall chronic condition in not a new concern.  She was started on Dramamine for dizziness which the patient reported improved symptoms somewhat.  On 09/11/2023 patient's daughter called the office as her blood pressure was low at 95/69 with heart rate 96 that morning.  Patient declined returning to the ER.  It was recommended that she cut her metoprolol in half and take half a pill the following morning if her blood pressure was above 100 and take another half pill in the evening if her blood pressure was above 100.  Today she presents for an acute visit regarding low blood pressure and  tachycardia.  She reports that she has felt overall poorly this weekend, she was unaware if she was in atrial fibrillation.  On review of her blood pressure log her heart rate on Friday increased to 98 and has been in the 90s to low 100s.  Her blood pressure averages 120/90.  Prior to these readings her blood pressure was 110-120/70 with heart rates in the 50s.  Her EKG today indicates atrial flutter at 155 bpm, blood pressure  stable at 140/68 then 134/70.  She denies chest pain, shortness of breath, palpitations, lower extremity edema, orthopnea or PND.  She reports that she just feels tired and will have some slight dizziness when for standing, resolves after a few seconds.  Labwork independently reviewed: 04/21/2023: Sodium 138, potassium 4.4, creatinine 0.98, AST 33, ALT 21  ROS: .   Today she denies chest pain, shortness of breath, lower extremity edema, palpitations, melena, hematuria, hemoptysis, diaphoresis, weakness, presyncope, syncope, orthopnea, and PND.  All other systems are reviewed and otherwise negative. Studies Reviewed: Marland Kitchen    EKG:  EKG is ordered today, personally reviewed, demonstrating  EKG Interpretation Date/Time:  Monday September 13 2023 14:31:23 EST Ventricular Rate:  153 PR Interval:    QRS Duration:  74 QT Interval:  286 QTC Calculation: 456 R Axis:   74  Text Interpretation: Atrial flutter with variable A-V block T wave abnormality, consider lateral ischemia Confirmed by Reather Littler [id number]) on 09/13/2023 3:22:16 PM   CV Studies: Cardiac Studies & Procedures       ECHOCARDIOGRAM  ECHOCARDIOGRAM COMPLETE 05/03/2023  Narrative ECHOCARDIOGRAM REPORT    Patient Name:   Diana Lee Date of Exam: 05/03/2023 Medical Rec #:  [id number]   Height:       65.0 in Accession #:    [id number]  Weight:       140.0 lb Date of Birth:  [birth year]   BSA:          1.700 m Patient Age:    89 years    BP:           154/64 mmHg Patient Gender: F           HR:           54 bpm. Exam Location:  Outpatient  Procedure: 2D Echo, Cardiac Doppler and Color Doppler  Indications:    Palpitations [785.1.ICD-9-CM]  History:        Patient has prior history of Echocardiogram examinations, most recent 07/29/2021. Arrythmias:Atrial Flutter; Signs/Symptoms:Dizziness/Lightheadedness.  Sonographer:    Eulah Pont RDCS Referring Phys: [id number] Parke Poisson   Sonographer Comments: Image acquisition  challenging due to respiratory motion. IMPRESSIONS   1. Left ventricular ejection fraction, by estimation, is 60 to 65%. The left ventricle has normal function. The left ventricle has no regional wall motion abnormalities. Left ventricular diastolic parameters were normal. 2. Right ventricular systolic function is mildly reduced. The right ventricular size is normal. There is normal pulmonary artery systolic pressure. 3. Right atrial size was mildly dilated. 4. Mild mitral valve regurgitation. 5. Tricuspid valve regurgitation is mild to moderate. 6. The aortic valve is tricuspid. Aortic valve regurgitation is not visualized. 7. The inferior vena cava is normal in size with <50% respiratory variability, suggesting right atrial pressure of 8 mmHg.  Comparison(s): The left ventricular function is unchanged.  FINDINGS Left Ventricle: Left ventricular ejection fraction, by estimation, is 60 to 65%. The left ventricle has normal function. The left ventricle has no regional wall motion abnormalities. The left  ventricular internal cavity size was normal in size. There is no left ventricular hypertrophy. Left ventricular diastolic parameters were normal.  Right Ventricle: The right ventricular size is normal. Right vetricular wall thickness was not assessed. Right ventricular systolic function is mildly reduced. There is normal pulmonary artery systolic pressure. The tricuspid regurgitant velocity is 2.35 m/s, and with an assumed right atrial pressure of 3 mmHg, the estimated right ventricular systolic pressure is 25.1 mmHg.  Left Atrium: Left atrial size was normal in size.  Right Atrium: Right atrial size was mildly dilated.  Pericardium: Trivial pericardial effusion is present.  Mitral Valve: There is mild thickening of the mitral valve leaflet(s). Mild mitral valve regurgitation.  Tricuspid Valve: The tricuspid valve is normal in structure. Tricuspid valve regurgitation is mild to  moderate.  Aortic Valve: The aortic valve is tricuspid. Aortic valve regurgitation is not visualized.  Pulmonic Valve: The pulmonic valve was normal in structure. Pulmonic valve regurgitation is trivial.  Aorta: The aortic root and ascending aorta are structurally normal, with no evidence of dilitation.  Venous: The inferior vena cava is normal in size with less than 50% respiratory variability, suggesting right atrial pressure of 8 mmHg.  IAS/Shunts: No atrial level shunt detected by color flow Doppler.   LEFT VENTRICLE PLAX 2D LVIDd:         4.00 cm     Diastology LVIDs:         2.70 cm     LV e' medial:    8.12 cm/s LV PW:         0.80 cm     LV E/e' medial:  10.1 LV IVS:        0.70 cm     LV e' lateral:   8.68 cm/s LVOT diam:     1.90 cm     LV E/e' lateral: 9.5 LV SV:         41 LV SV Index:   24 LVOT Area:     2.84 cm  LV Volumes (MOD) LV vol d, MOD A2C: 54.4 ml LV vol d, MOD A4C: 69.0 ml LV vol s, MOD A2C: 20.6 ml LV vol s, MOD A4C: 23.8 ml LV SV MOD A2C:     33.8 ml LV SV MOD A4C:     69.0 ml LV SV MOD BP:      39.1 ml  RIGHT VENTRICLE RV S prime:     10.80 cm/s TAPSE (M-mode): 2.3 cm  LEFT ATRIUM             Index        RIGHT ATRIUM           Index LA diam:        3.40 cm 2.00 cm/m   RA Area:     19.40 cm LA Vol (A2C):   31.0 ml 18.24 ml/m  RA Volume:   55.30 ml  32.53 ml/m LA Vol (A4C):   36.7 ml 21.59 ml/m LA Biplane Vol: 33.9 ml 19.94 ml/m AORTIC VALVE             PULMONIC VALVE LVOT Vmax:   53.50 cm/s  PR End Diast Vel: 3.06 msec LVOT Vmean:  35.100 cm/s LVOT VTI:    0.143 m  AORTA Ao Root diam: 2.70 cm Ao Asc diam:  3.20 cm  MITRAL VALVE                  TRICUSPID VALVE MV Area (PHT): 3.12 cm  TR Peak grad:   22.1 mmHg MV Decel Time: 243 msec       TR Vmax:        235.00 cm/s MR Peak grad:    89.3 mmHg MR Mean grad:    66.5 mmHg    SHUNTS MR Vmax:         472.50 cm/s  Systemic VTI:  0.14 m MR Vmean:        394.5 cm/s   Systemic  Diam: 1.90 cm MR PISA:         0.25 cm MR PISA Eff ROA: 2 mm MR PISA Radius:  0.20 cm MV E velocity: 82.30 cm/s MV A velocity: 61.80 cm/s MV E/A ratio:  1.33  Dietrich Pates MD Electronically signed by Dietrich Pates MD Signature Date/Time: 05/03/2023/9:40:19 PM    Final    MONITORS  LONG TERM MONITOR (3-14 DAYS) 01/09/2022  Narrative Patch Wear Time:  13 days and 22 hours (2023-02-16T17:12:05-499 to 2023-03-02T15:42:30-0500)  Patient had a min HR of 43 bpm, max HR of 190 bpm, and avg HR of 59 bpm. Predominant underlying rhythm was Sinus Rhythm. 5 Ventricular Tachycardia runs occurred, the run with the fastest interval lasting 6 beats with a max rate of 160 bpm, the longest lasting 7 beats with an avg rate of 139 bpm. 345 Supraventricular Tachycardia runs occurred, the run with the fastest interval lasting 5 beats with a max rate of 190 bpm, the longest lasting 15.4 secs with an avg rate of 102 bpm. Some episodes of Supraventricular Tachycardia may be possible Atrial Tachycardia with variable block. Atrial Fibrillation occurred (<1% burden), ranging from 69-110 bpm (avg of 88 bpm), the longest lasting 3 mins 13 secs with an avg rate of 88 bpm. Isolated SVEs were rare (<1.0%), SVE Couplets were rare (<1.0%), and SVE Triplets were rare (<1.0%). Isolated VEs were occasional (1.3%, 15793), VE Couplets were rare (<1.0%, 2402), and VE Triplets were rare (<1.0%, 56).            Current Reported Medications:.    Current Meds  Medication Sig   acetaminophen (TYLENOL) 500 MG tablet Take 500 mg by mouth every 6 (six) hours as needed for mild pain or headache.   amiodarone (PACERONE) 200 MG tablet Take 400 mg twice a day for one week. Then take 200 mg twice a day for two weeks. Then take 200 mg daily.   apixaban (ELIQUIS) 5 MG TABS tablet Take 1 tablet (5 mg total) by mouth 2 (two) times daily.   Calcium Carbonate (CALTRATE 600 PO) Take 1 tablet by mouth daily.   cholecalciferol (VITAMIN D3) 25 MCG  (1000 UNIT) tablet Take 1,000 Units by mouth daily.   levothyroxine (SYNTHROID) 50 MCG tablet Take 75 mcg by mouth daily before breakfast.   metoprolol succinate (TOPROL XL) 25 MG 24 hr tablet Take 1 tablet (25 mg total) by mouth daily.   sertraline (ZOLOFT) 50 MG tablet Take 50 mg by mouth daily.   [DISCONTINUED] metoprolol succinate (TOPROL XL) 25 MG 24 hr tablet Take 25 mg by mouth daily.   Physical Exam:    VS:  BP 134/70   Pulse (!) 153   Ht 5\' 5"  (1.651 m)   Wt 140 lb 6.4 oz (63.7 kg)   SpO2 99%   BMI 23.36 kg/m    Wt Readings from Last 3 Encounters:  09/13/23 140 lb 6.4 oz (63.7 kg)  05/05/23 138 lb (62.6 kg)  04/21/23 140 lb (63.5 kg)    GEN:  Well nourished, well developed in no acute distress NECK: No JVD; No carotid bruits CARDIAC: irregular RR, no murmurs, rubs, gallops RESPIRATORY:  Clear to auscultation without rales, wheezing or rhonchi  ABDOMEN: Soft, non-tender, non-distended EXTREMITIES:  No edema; No acute deformity   Asessement and Plan:.   Atrial flutter/dizziness/fatigue: She underwent EP study and catheter ablation of atrial fibrillation on 04/08/2021 with Dr. Ladona Ridgel. 12/2021 she wore a cardiac monitor for 13 days that showed an average heart rate of 59 bpm, ranged from 43 bpm to 190 bpm.  Predominant underlying rhythm was sinus rhythm.  She had 5 runs of ventricular tachycardia, 345 supraventricular tachycardia runs. She had a less than 1% burden of atrial fibrillation ranging from 69 to 110 bpm.  Today she presented to the office with increased fatigue and dizziness, EKG shows atrial flutter at 153 bpm.  She reports that she did not take her metoprolol this morning. She denies chest pain, shortness of breath, lower extremity edema, orthopnea or PND.  She preferred not to go to the emergency room.  Reviewed with Dr. Servando Salina, (DOD at [facility] today), she recommended loading with amiodarone and outpatient cardioversion as patient has been compliant with her Eliquis.   Discussed with patient, she is agreeable to starting on amiodarone however she deferred cardioversion. She reports she has had a cardioversion in the past and did not tolerate the sedation well. Reviewed ED precautions.  Start amiodarone 400 mg twice daily for 1 week, 200 mg twice daily for 2 weeks then 200 mg daily. Increase metoprolol succinate to 25 mg daily. Check CBC, CMET, TSH and Magnesium level today. Will have close follow up on 11/15 to reassess rate control.   Hypercoagulable state: Patient reports that she has been compliant with Eliquis 5 mg twice daily.  She denies any bleeding problems.  Check CBC and CMET today.   Hypothyroidism: Last TSH 3.020 on 11/12/2022.  On Synthroid 75 mcg daily.  Check TSH.  Disposition: F/u with Dr. Cristela Felt, NP or APP on 09/17/23.   Signed, Rip Harbour, NP

## 2023-09-13 NOTE — Progress Notes (Signed)
Cardiology Office Note    Date:  09/13/2023  ID:  Diana Lee, DOB [birth year], MRN [id number] PCP:  [name], MD  Cardiologist:  Parke Poisson, MD  Electrophysiologist:  Lewayne Bunting, MD   Chief Complaint: Hypotension and bradycardia   History of Present Illness: .    Diana Lee is a 89 y.o. female with visit-pertinent history of hypothyroidism, depression, atrial flutter.  She underwent EP study and catheter ablation of atrial fibrillation on 04/08/2021 with Dr. Ladona Ridgel.  She has over the years noted concern with fatigue and some positional dizziness.  In 12/2021 she wore a cardiac monitor for 13 days that showed an average heart rate of 59 bpm, ranged from 43 bpm to 190 bpm.  Predominant underlying rhythm was sinus rhythm.  She had 5 runs of ventricular tachycardia, the run with the fastest interval lasting 6 beats with a max rate of 160 bpm, the longest lasting 7 beats with an average rate of 139 bpm.  She had 345 supraventricular tachycardia runs, the run with the fastest interval lasting 5 beats with a max rate of 190 bpm.  She had a less than 1% burden of atrial fibrillation ranging from 69 to 110 bpm.  Also reviewed by EP, it was felt these episodes would not contribute to her fatigue.  Recommended that she resume metoprolol.  It was felt that her fatigue and dizziness was likely secondary to deconditioning.  At visit on 12/03/2022 she presented with her son, he remarks that her blood pressure had been labile.  Patient felt that her fatigue was likely related to having low blood pressure, in clinic initial reading was 155/67 on recheck was 134/72.  On 02/23/2023 she presented for an acute care visit for dizziness, lightheadedness with tachypalpitations and heart rates in the 140s.  Her blood pressure was low at 100/60.  Given known cause of tachypalpitations and being on a blood thinner through shared decision making a repeat cardiac monitor was deferred.  It was determined that she  would stay on low-dose metoprolol with an additional dose if tachycardia persist and blood pressures greater than 100/60.  Did that blood pressure support medications could be considered however her blood pressure does occasionally rise to 150s over 90s and she is symptomatic with fatigue with this.  It was recommended that she wear compression socks daily and liberalize salt intake while hypotensive.  Echocardiogram on 05/03/2023 indicated LVEF of 60 to 65%, LV with normal function, no RWMA, diastolic parameters were normal, she had normal pulmonary artery systolic pressure.  RA was mildly dilated.  She had mild mitral valve regurgitation, mild to moderate tricuspid valve regurgitation.  She was last seen in clinic on 05/05/2023 for worsening fatigue and dizziness.  She was seen in the ER with reassuring testing in mid June and then subsequently saw her primary care physician who felt dizziness with an overall chronic condition in not a new concern.  She was started on Dramamine for dizziness which the patient reported improved symptoms somewhat.  On 09/11/2023 patient's daughter called the office as her blood pressure was low at 95/69 with heart rate 96 that morning.  Patient declined returning to the ER.  It was recommended that she cut her metoprolol in half and take half a pill the following morning if her blood pressure was above 100 and take another half pill in the evening if her blood pressure was above 100.  Today she presents for an acute visit regarding low blood pressure and  tachycardia.  She reports that she has felt overall poorly this weekend, she was unaware if she was in atrial fibrillation.  On review of her blood pressure log her heart rate on Friday increased to 98 and has been in the 90s to low 100s.  Her blood pressure averages 120/90.  Prior to these readings her blood pressure was 110-120/70 with heart rates in the 50s.  Her EKG today indicates atrial flutter at 155 bpm, blood pressure  stable at 140/68 then 134/70.  She denies chest pain, shortness of breath, palpitations, lower extremity edema, orthopnea or PND.  She reports that she just feels tired and will have some slight dizziness when for standing, resolves after a few seconds.  Labwork independently reviewed: 04/21/2023: Sodium 138, potassium 4.4, creatinine 0.98, AST 33, ALT 21  ROS: .   Today she denies chest pain, shortness of breath, lower extremity edema, palpitations, melena, hematuria, hemoptysis, diaphoresis, weakness, presyncope, syncope, orthopnea, and PND.  All other systems are reviewed and otherwise negative. Studies Reviewed: Marland Kitchen    EKG:  EKG is ordered today, personally reviewed, demonstrating  EKG Interpretation Date/Time:  Monday September 13 2023 14:31:23 EST Ventricular Rate:  153 PR Interval:    QRS Duration:  74 QT Interval:  286 QTC Calculation: 456 R Axis:   74  Text Interpretation: Atrial flutter with variable A-V block T wave abnormality, consider lateral ischemia Confirmed by Reather Littler [id number]) on 09/13/2023 3:22:16 PM   CV Studies: Cardiac Studies & Procedures       ECHOCARDIOGRAM  ECHOCARDIOGRAM COMPLETE 05/03/2023  Narrative ECHOCARDIOGRAM REPORT    Patient Name:   Diana Lee Date of Exam: 05/03/2023 Medical Rec #:  [id number]   Height:       65.0 in Accession #:    [id number]  Weight:       140.0 lb Date of Birth:  [birth year]   BSA:          1.700 m Patient Age:    89 years    BP:           154/64 mmHg Patient Gender: F           HR:           54 bpm. Exam Location:  Outpatient  Procedure: 2D Echo, Cardiac Doppler and Color Doppler  Indications:    Palpitations [785.1.ICD-9-CM]  History:        Patient has prior history of Echocardiogram examinations, most recent 07/29/2021. Arrythmias:Atrial Flutter; Signs/Symptoms:Dizziness/Lightheadedness.  Sonographer:    Eulah Pont RDCS Referring Phys: [id number] Parke Poisson   Sonographer Comments: Image acquisition  challenging due to respiratory motion. IMPRESSIONS   1. Left ventricular ejection fraction, by estimation, is 60 to 65%. The left ventricle has normal function. The left ventricle has no regional wall motion abnormalities. Left ventricular diastolic parameters were normal. 2. Right ventricular systolic function is mildly reduced. The right ventricular size is normal. There is normal pulmonary artery systolic pressure. 3. Right atrial size was mildly dilated. 4. Mild mitral valve regurgitation. 5. Tricuspid valve regurgitation is mild to moderate. 6. The aortic valve is tricuspid. Aortic valve regurgitation is not visualized. 7. The inferior vena cava is normal in size with <50% respiratory variability, suggesting right atrial pressure of 8 mmHg.  Comparison(s): The left ventricular function is unchanged.  FINDINGS Left Ventricle: Left ventricular ejection fraction, by estimation, is 60 to 65%. The left ventricle has normal function. The left ventricle has no regional wall motion abnormalities. The left  ventricular internal cavity size was normal in size. There is no left ventricular hypertrophy. Left ventricular diastolic parameters were normal.  Right Ventricle: The right ventricular size is normal. Right vetricular wall thickness was not assessed. Right ventricular systolic function is mildly reduced. There is normal pulmonary artery systolic pressure. The tricuspid regurgitant velocity is 2.35 m/s, and with an assumed right atrial pressure of 3 mmHg, the estimated right ventricular systolic pressure is 25.1 mmHg.  Left Atrium: Left atrial size was normal in size.  Right Atrium: Right atrial size was mildly dilated.  Pericardium: Trivial pericardial effusion is present.  Mitral Valve: There is mild thickening of the mitral valve leaflet(s). Mild mitral valve regurgitation.  Tricuspid Valve: The tricuspid valve is normal in structure. Tricuspid valve regurgitation is mild to  moderate.  Aortic Valve: The aortic valve is tricuspid. Aortic valve regurgitation is not visualized.  Pulmonic Valve: The pulmonic valve was normal in structure. Pulmonic valve regurgitation is trivial.  Aorta: The aortic root and ascending aorta are structurally normal, with no evidence of dilitation.  Venous: The inferior vena cava is normal in size with less than 50% respiratory variability, suggesting right atrial pressure of 8 mmHg.  IAS/Shunts: No atrial level shunt detected by color flow Doppler.   LEFT VENTRICLE PLAX 2D LVIDd:         4.00 cm     Diastology LVIDs:         2.70 cm     LV e' medial:    8.12 cm/s LV PW:         0.80 cm     LV E/e' medial:  10.1 LV IVS:        0.70 cm     LV e' lateral:   8.68 cm/s LVOT diam:     1.90 cm     LV E/e' lateral: 9.5 LV SV:         41 LV SV Index:   24 LVOT Area:     2.84 cm  LV Volumes (MOD) LV vol d, MOD A2C: 54.4 ml LV vol d, MOD A4C: 69.0 ml LV vol s, MOD A2C: 20.6 ml LV vol s, MOD A4C: 23.8 ml LV SV MOD A2C:     33.8 ml LV SV MOD A4C:     69.0 ml LV SV MOD BP:      39.1 ml  RIGHT VENTRICLE RV S prime:     10.80 cm/s TAPSE (M-mode): 2.3 cm  LEFT ATRIUM             Index        RIGHT ATRIUM           Index LA diam:        3.40 cm 2.00 cm/m   RA Area:     19.40 cm LA Vol (A2C):   31.0 ml 18.24 ml/m  RA Volume:   55.30 ml  32.53 ml/m LA Vol (A4C):   36.7 ml 21.59 ml/m LA Biplane Vol: 33.9 ml 19.94 ml/m AORTIC VALVE             PULMONIC VALVE LVOT Vmax:   53.50 cm/s  PR End Diast Vel: 3.06 msec LVOT Vmean:  35.100 cm/s LVOT VTI:    0.143 m  AORTA Ao Root diam: 2.70 cm Ao Asc diam:  3.20 cm  MITRAL VALVE                  TRICUSPID VALVE MV Area (PHT): 3.12 cm  TR Peak grad:   22.1 mmHg MV Decel Time: 243 msec       TR Vmax:        235.00 cm/s MR Peak grad:    89.3 mmHg MR Mean grad:    66.5 mmHg    SHUNTS MR Vmax:         472.50 cm/s  Systemic VTI:  0.14 m MR Vmean:        394.5 cm/s   Systemic  Diam: 1.90 cm MR PISA:         0.25 cm MR PISA Eff ROA: 2 mm MR PISA Radius:  0.20 cm MV E velocity: 82.30 cm/s MV A velocity: 61.80 cm/s MV E/A ratio:  1.33  Dietrich Pates MD Electronically signed by Dietrich Pates MD Signature Date/Time: 05/03/2023/9:40:19 PM    Final    MONITORS  LONG TERM MONITOR (3-14 DAYS) 01/09/2022  Narrative Patch Wear Time:  13 days and 22 hours (2023-02-16T17:12:05-499 to 2023-03-02T15:42:30-0500)  Patient had a min HR of 43 bpm, max HR of 190 bpm, and avg HR of 59 bpm. Predominant underlying rhythm was Sinus Rhythm. 5 Ventricular Tachycardia runs occurred, the run with the fastest interval lasting 6 beats with a max rate of 160 bpm, the longest lasting 7 beats with an avg rate of 139 bpm. 345 Supraventricular Tachycardia runs occurred, the run with the fastest interval lasting 5 beats with a max rate of 190 bpm, the longest lasting 15.4 secs with an avg rate of 102 bpm. Some episodes of Supraventricular Tachycardia may be possible Atrial Tachycardia with variable block. Atrial Fibrillation occurred (<1% burden), ranging from 69-110 bpm (avg of 88 bpm), the longest lasting 3 mins 13 secs with an avg rate of 88 bpm. Isolated SVEs were rare (<1.0%), SVE Couplets were rare (<1.0%), and SVE Triplets were rare (<1.0%). Isolated VEs were occasional (1.3%, 15793), VE Couplets were rare (<1.0%, 2402), and VE Triplets were rare (<1.0%, 56).            Current Reported Medications:.    Current Meds  Medication Sig   acetaminophen (TYLENOL) 500 MG tablet Take 500 mg by mouth every 6 (six) hours as needed for mild pain or headache.   amiodarone (PACERONE) 200 MG tablet Take 400 mg twice a day for one week. Then take 200 mg twice a day for two weeks. Then take 200 mg daily.   apixaban (ELIQUIS) 5 MG TABS tablet Take 1 tablet (5 mg total) by mouth 2 (two) times daily.   Calcium Carbonate (CALTRATE 600 PO) Take 1 tablet by mouth daily.   cholecalciferol (VITAMIN D3) 25 MCG  (1000 UNIT) tablet Take 1,000 Units by mouth daily.   levothyroxine (SYNTHROID) 50 MCG tablet Take 75 mcg by mouth daily before breakfast.   metoprolol succinate (TOPROL XL) 25 MG 24 hr tablet Take 1 tablet (25 mg total) by mouth daily.   sertraline (ZOLOFT) 50 MG tablet Take 50 mg by mouth daily.   [DISCONTINUED] metoprolol succinate (TOPROL XL) 25 MG 24 hr tablet Take 25 mg by mouth daily.   Physical Exam:    VS:  BP 134/70   Pulse (!) 153   Ht 5\' 5"  (1.651 m)   Wt 140 lb 6.4 oz (63.7 kg)   SpO2 99%   BMI 23.36 kg/m    Wt Readings from Last 3 Encounters:  09/13/23 140 lb 6.4 oz (63.7 kg)  05/05/23 138 lb (62.6 kg)  04/21/23 140 lb (63.5 kg)    GEN:  Well nourished, well developed in no acute distress NECK: No JVD; No carotid bruits CARDIAC: irregular RR, no murmurs, rubs, gallops RESPIRATORY:  Clear to auscultation without rales, wheezing or rhonchi  ABDOMEN: Soft, non-tender, non-distended EXTREMITIES:  No edema; No acute deformity   Asessement and Plan:.   Atrial flutter/dizziness/fatigue: She underwent EP study and catheter ablation of atrial fibrillation on 04/08/2021 with Dr. Ladona Ridgel. 12/2021 she wore a cardiac monitor for 13 days that showed an average heart rate of 59 bpm, ranged from 43 bpm to 190 bpm.  Predominant underlying rhythm was sinus rhythm.  She had 5 runs of ventricular tachycardia, 345 supraventricular tachycardia runs. She had a less than 1% burden of atrial fibrillation ranging from 69 to 110 bpm.  Today she presented to the office with increased fatigue and dizziness, EKG shows atrial flutter at 153 bpm.  She reports that she did not take her metoprolol this morning. She denies chest pain, shortness of breath, lower extremity edema, orthopnea or PND.  She preferred not to go to the emergency room.  Reviewed with Dr. Servando Salina, (DOD at [facility] today), she recommended loading with amiodarone and outpatient cardioversion as patient has been compliant with her Eliquis.   Discussed with patient, she is agreeable to starting on amiodarone however she deferred cardioversion. She reports she has had a cardioversion in the past and did not tolerate the sedation well. Reviewed ED precautions.  Start amiodarone 400 mg twice daily for 1 week, 200 mg twice daily for 2 weeks then 200 mg daily. Increase metoprolol succinate to 25 mg daily. Check CBC, CMET, TSH and Magnesium level today. Will have close follow up on 11/15 to reassess rate control.   Hypercoagulable state: Patient reports that she has been compliant with Eliquis 5 mg twice daily.  She denies any bleeding problems.  Check CBC and CMET today.   Hypothyroidism: Last TSH 3.020 on 11/12/2022.  On Synthroid 75 mcg daily.  Check TSH.  Disposition: F/u with Dr. Cristela Felt, NP or APP on 09/17/23.   Signed, Rip Harbour, NP

## 2023-09-13 NOTE — Telephone Encounter (Addendum)
Called and spoke with patient's daughter concerning patient. She report patient felt better last night but continue to have increase heart rate, dizziness and weakness. Per Becky her HR was 96-110 and BP was low at times. Patient still declined to go to the ED. She did not have BP readings at the time because she was at home and no with the patient. Scheduled patient for an appt with Reather Littler, NP for 2:15 pm. Did remind her to bring BP reading to appt.

## 2023-09-14 ENCOUNTER — Telehealth: Payer: Self-pay | Admitting: Internal Medicine

## 2023-09-14 ENCOUNTER — Telehealth: Payer: Self-pay

## 2023-09-14 DIAGNOSIS — I483 Typical atrial flutter: Secondary | ICD-10-CM

## 2023-09-14 LAB — COMPREHENSIVE METABOLIC PANEL
ALT: 12 [IU]/L (ref 0–32)
AST: 26 [IU]/L (ref 0–40)
Albumin: 4.3 g/dL (ref 3.6–4.6)
Alkaline Phosphatase: 75 [IU]/L (ref 44–121)
BUN/Creatinine Ratio: 15 (ref 12–28)
BUN: 20 mg/dL (ref 10–36)
Bilirubin Total: 0.4 mg/dL (ref 0.0–1.2)
CO2: 24 mmol/L (ref 20–29)
Calcium: 10 mg/dL (ref 8.7–10.3)
Chloride: 99 mmol/L (ref 96–106)
Creatinine, Ser: 1.31 mg/dL — ABNORMAL HIGH (ref 0.57–1.00)
Globulin, Total: 2.8 g/dL (ref 1.5–4.5)
Glucose: 91 mg/dL (ref 70–99)
Potassium: 5.2 mmol/L (ref 3.5–5.2)
Sodium: 137 mmol/L (ref 134–144)
Total Protein: 7.1 g/dL (ref 6.0–8.5)
eGFR: 38 mL/min/{1.73_m2} — ABNORMAL LOW (ref >59)

## 2023-09-14 LAB — CBC
Hematocrit: 44.7 % (ref 34.0–46.6)
Hemoglobin: 14.7 g/dL (ref 11.1–15.9)
MCH: 33.5 pg — ABNORMAL HIGH (ref 26.6–33.0)
MCHC: 32.9 g/dL (ref 31.5–35.7)
MCV: 102 fL — ABNORMAL HIGH (ref 79–97)
Platelets: 208 10*3/uL (ref 150–450)
RBC: 4.39 x10E6/uL (ref 3.77–5.28)
RDW: 11.5 % — ABNORMAL LOW (ref 11.7–15.4)
WBC: 9 10*3/uL (ref 3.4–10.8)

## 2023-09-14 LAB — MAGNESIUM: Magnesium: 2.2 mg/dL (ref 1.6–2.3)

## 2023-09-14 LAB — TSH: TSH: 3.59 u[IU]/mL (ref 0.450–4.500)

## 2023-09-14 NOTE — Anesthesia Preprocedure Evaluation (Signed)
Anesthesia Evaluation    Reviewed: Allergy & Precautions, Patient's Chart, lab work & pertinent test results, reviewed documented beta blocker date and time   Airway        Dental   Pulmonary neg pulmonary ROS          Cardiovascular + dysrhythmias (eliquis) Atrial Fibrillation  Rhythm:Irregular  TTE 2024 1. Left ventricular ejection fraction, by estimation, is 60 to 65%. The  left ventricle has normal function. The left ventricle has no regional  wall motion abnormalities. Left ventricular diastolic parameters were  normal.   2. Right ventricular systolic function is mildly reduced. The right  ventricular size is normal. There is normal pulmonary artery systolic  pressure.   3. Right atrial size was mildly dilated.   4. Mild mitral valve regurgitation.   5. Tricuspid valve regurgitation is mild to moderate.   6. The aortic valve is tricuspid. Aortic valve regurgitation is not  visualized.   7. The inferior vena cava is normal in size with <50% respiratory  variability, suggesting right atrial pressure of 8 mmHg.     Neuro/Psych  PSYCHIATRIC DISORDERS  Depression    negative neurological ROS     GI/Hepatic negative GI ROS, Neg liver ROS,,,  Endo/Other  Hypothyroidism    Renal/GU Renal InsufficiencyRenal diseaseLab Results      Component                Value               Date                      NA                       137                 09/13/2023                CL                       99                  09/13/2023                K                        5.2                 09/13/2023                CO2                      24                  09/13/2023                BUN                      20                  09/13/2023                CREATININE               1.31 (H)            09/13/2023  EGFR                     38 (L)              09/13/2023                CALCIUM                  10.0                 09/13/2023                PHOS                     3.9                 12/24/2020                ALBUMIN                  4.3                 09/13/2023                GLUCOSE                  91                  09/13/2023             negative genitourinary   Musculoskeletal negative musculoskeletal ROS (+)    Abdominal   Peds  Hematology negative hematology ROS (+)   Anesthesia Other Findings   Reproductive/Obstetrics                             Anesthesia Physical Anesthesia Plan  ASA: 3  Anesthesia Plan: General   Post-op Pain Management:    Induction: Intravenous  PONV Risk Score and Plan: 3 and Propofol infusion and Treatment may vary due to age or medical condition  Airway Management Planned: Natural Airway  Additional Equipment:   Intra-op Plan:   Post-operative Plan:   Informed Consent: I have reviewed the patients History and Physical, chart, labs and discussed the procedure including the risks, benefits and alternatives for the proposed anesthesia with the patient or authorized representative who has indicated his/her understanding and acceptance.     Dental advisory given  Plan Discussed with: CRNA  Anesthesia Plan Comments: (Procedure cancelled. Pt in sinus rhythm)       Anesthesia Quick Evaluation

## 2023-09-14 NOTE — Telephone Encounter (Signed)
Pt c/o medication issue:  1. Name of Medication: AMIODARONE   2. How are you currently taking this medication (dosage and times per day)? TAKE 400 mg twice a day for one week.  Then take 200 mg twice a day for two weeks.  Then take 200 mg daily  3. Are you having a reaction (difficulty breathing--STAT)? No  4. What is your medication issue? Patient's daughter is calling because she went to get this medication. The pharmacist mentioned this medication could increase her symptoms. Patient's daughter is concerned and would like to her Dr. Lupe Carney opinion. Patient's daughter stated she will not give the patient the medication until they get the okay from [place]. Please advise.

## 2023-09-14 NOTE — Telephone Encounter (Signed)
Spoke to daughter on phone. Pt HR in office is 153 and she is feeling weak and tired, with hypotension over weekend. I think repeat DCCV is indicated although she did have hypotension and bradycardia with the last DCCV 12/2020. We will try to keep sedation as light as possible, and appreciate anesthesia support. Discussed risks and benefits with daughter and they are willing to proceed. No missed doses of AC in 30 days. She started amiodarone which we will continue after cardioversion.    Shared Decision Making/Informed Consent The risks (stroke, cardiac arrhythmias rarely resulting in the need for a temporary or permanent pacemaker, skin irritation or burns and complications associated with conscious sedation including aspiration, arrhythmia, respiratory failure and death), benefits (restoration of normal sinus rhythm) and alternatives of a direct current cardioversion were explained in detail to Diana Lee and she agrees to proceed.

## 2023-09-14 NOTE — Telephone Encounter (Signed)
Left message to call back  

## 2023-09-14 NOTE — Telephone Encounter (Signed)
-----   Message from Rip Harbour sent at 09/14/2023 10:56 AM EST ----- Please let Diana Lee know that her blood counts are stable, her kidney function is slightly decreased compared to prior results, recommend she stay hydrated with water. Her electrolytes are normal. Her thyroid function and liver function is normal. Continue with plan as discussed and follow up with Edd Fabian, NP as scheduled on 09/17/23.

## 2023-09-14 NOTE — Telephone Encounter (Signed)
Patient identification verified by 2 forms. Diana Rail, RN   Called and spoke patients daughter Diana Lee states:   -presented yesterday with sx of dizziness   -concerned amiodarone will make dizziness worse   -was advised by pharmacy to start Rx this morning due to side effects  -did review side effect yesterday with NP Chad   -would like additional input from Dr. Jacques Navy, to know if she agrees with Rx change  Reviewed visit note with patients daughter and provided education for Amiodarone Rx  Diana Lee stated she will begin with loading dose this morning  Informed Diana Lee message sent to Dr. Jacques Navy as well  Reviewed ED warning signs/precautions  Diana Lee verbalized understanding, no questions at this time

## 2023-09-14 NOTE — Telephone Encounter (Signed)
Advised of below they verbalized understanding.

## 2023-09-14 NOTE — Progress Notes (Deleted)
Cardiology Clinic Note   Patient Name: Diana Lee Date of Encounter: 09/14/2023  Primary Care Provider:  [name], MD Primary Cardiologist:  Parke Poisson, MD  Patient Profile    ***  Past Medical History    Past Medical History:  Diagnosis Date   Atrial flutter [facility])    Depression    Thyroid disease    Past Surgical History:  Procedure Laterality Date   A-FLUTTER ABLATION N/A 04/07/2021   Procedure: A-FLUTTER ABLATION;  Surgeon: Marinus Maw, MD;  Location: [facility] INVASIVE CV LAB;  Service: Cardiovascular;  Laterality: N/A;   CARDIOVERSION N/A 01/14/2021   Procedure: CARDIOVERSION;  Surgeon: Parke Poisson, MD;  Location: [facility] ENDOSCOPY;  Service: Cardiovascular;  Laterality: N/A;    Allergies  No Known Allergies  History of Present Illness    ***  Home Medications    Prior to Admission medications   Medication Sig Start Date End Date Taking? Authorizing Provider  acetaminophen (TYLENOL) 650 MG CR tablet Take 650-1,300 mg by mouth See admin instructions. Take 1300 mg in the morning and 650 mg at night    [provider]  amiodarone (PACERONE) 200 MG tablet Take 400 mg twice a day for one week. Then take 200 mg twice a day for two weeks. Then take 200 mg daily. 09/13/23   Reather Littler D, NP  apixaban (ELIQUIS) 5 MG TABS tablet Take 1 tablet (5 mg total) by mouth 2 (two) times daily. 09/03/23   Parke Poisson, MD  calcium carbonate (OSCAL) 1500 (600 Ca) MG TABS tablet Take 600 mg of elemental calcium by mouth daily.    [provider]  cholecalciferol (VITAMIN D3) 25 MCG (1000 UNIT) tablet Take 1,000 Units by mouth daily.    [provider]  levothyroxine (SYNTHROID) 75 MCG tablet Take 75 mcg by mouth daily before breakfast.    [provider]  metoprolol succinate (TOPROL XL) 25 MG 24 hr tablet Take 1 tablet (25 mg total) by mouth daily. 09/13/23   Reather Littler D, NP  sertraline (ZOLOFT) 100 MG tablet Take 50 mg by  mouth daily.    [provider]  TART CHERRY PO Take 10,500 mg by mouth daily. Extract    [provider]    Family History    Family History  Problem Relation Age of Onset   Stomach cancer Mother    She indicated that her mother is deceased. She indicated that her father is deceased.  Social History    Social History   Socioeconomic History   Marital status: Married    Spouse name: Not on file   Number of children: Not on file   Years of education: Not on file   Highest education level: Not on file  Occupational History   Not on file  Tobacco Use   Smoking status: Never   Smokeless tobacco: Never  Vaping Use   Vaping status: Never Used  Substance and Sexual Activity   Alcohol use: Never   Drug use: Never   Sexual activity: Not Currently  Other Topics Concern   Not on file  Social History Narrative   Not on file   Social Determinants of Health   Financial Resource Strain: Not on file  Food Insecurity: Not on file  Transportation Needs: Not on file  Physical Activity: Not on file  Stress: Not on file  Social Connections: Not on file  Intimate Partner Violence: Not on file     Review  of Systems    General:  No chills, fever, night sweats or weight changes.  Cardiovascular:  No chest pain, dyspnea on exertion, edema, orthopnea, palpitations, paroxysmal nocturnal dyspnea. Dermatological: No rash, lesions/masses Respiratory: No cough, dyspnea Urologic: No hematuria, dysuria Abdominal:   No nausea, vomiting, diarrhea, bright red blood per rectum, melena, or hematemesis Neurologic:  No visual changes, wkns, changes in mental status. All other systems reviewed and are otherwise negative except as noted above.  Physical Exam    VS:  There were no vitals taken for this visit. , BMI There is no height or weight on file to calculate BMI. GEN: Well nourished, well developed, in no acute distress. HEENT: normal. Neck: Supple, no JVD, carotid  bruits, or masses. Cardiac: RRR, no murmurs, rubs, or gallops. No clubbing, cyanosis, edema.  Radials/DP/PT 2+ and equal bilaterally.  Respiratory:  Respirations regular and unlabored, clear to auscultation bilaterally. GI: Soft, nontender, nondistended, BS + x 4. MS: no deformity or atrophy. Skin: warm and dry, no rash. Neuro:  Strength and sensation are intact. Psych: Normal affect.  Accessory Clinical Findings    Recent Labs: 04/21/2023: B Natriuretic Peptide 265.7 09/13/2023: ALT 12; BUN 20; Creatinine, Ser 1.31; Hemoglobin 14.7; Magnesium 2.2; Platelets 208; Potassium 5.2; Sodium 137; TSH 3.590   Recent Lipid Panel No results found for: "CHOL", "TRIG", "HDL", "CHOLHDL", "VLDL", "LDLCALC", "LDLDIRECT"       ECG personally reviewed by me today- ***          Assessment & Plan   1.  ***   Thomasene Ripple. Ronte Parker NP-C     09/14/2023, 5:04 PM [facility] [address] Office [phone] Fax [phone]    I spent***minutes examining this patient, reviewing medications, and using patient centered shared decision making involving her cardiac care.   I spent greater than 20 minutes reviewing her past medical history,  medications, and prior cardiac tests.

## 2023-09-14 NOTE — Progress Notes (Signed)
Spoke to patient's daughter, [name] and instructed them to come at 07:45  and to be NPO after 0000. Medications reviewed.   Confirmed that patient will have a ride home and someone to stay with them for 24 hours after the procedure.

## 2023-09-15 ENCOUNTER — Other Ambulatory Visit: Payer: Self-pay

## 2023-09-15 ENCOUNTER — Ambulatory Visit (HOSPITAL_COMMUNITY): Payer: Self-pay | Admitting: Anesthesiology

## 2023-09-15 ENCOUNTER — Ambulatory Visit (HOSPITAL_COMMUNITY): Payer: Medicare Other | Admitting: Anesthesiology

## 2023-09-15 ENCOUNTER — Ambulatory Visit (HOSPITAL_COMMUNITY)
Admission: RE | Admit: 2023-09-15 | Discharge: 2023-09-15 | Disposition: A | Discharge status: Home / Self Care | Payer: Medicare Other | Attending: Internal Medicine | Admitting: Internal Medicine

## 2023-09-15 ENCOUNTER — Encounter (HOSPITAL_COMMUNITY)
Admission: RE | Disposition: A | Discharge status: Home / Self Care | Payer: Self-pay | Source: Home / Self Care | Attending: Internal Medicine

## 2023-09-15 ENCOUNTER — Telehealth: Payer: Self-pay

## 2023-09-15 DIAGNOSIS — I4892 Unspecified atrial flutter: Secondary | ICD-10-CM | POA: Diagnosis not present

## 2023-09-15 DIAGNOSIS — Z539 Procedure and treatment not carried out, unspecified reason: Secondary | ICD-10-CM | POA: Diagnosis not present

## 2023-09-15 DIAGNOSIS — E039 Hypothyroidism, unspecified: Secondary | ICD-10-CM | POA: Insufficient documentation

## 2023-09-15 DIAGNOSIS — F32A Depression, unspecified: Secondary | ICD-10-CM | POA: Insufficient documentation

## 2023-09-15 DIAGNOSIS — Z79890 Hormone replacement therapy (postmenopausal): Secondary | ICD-10-CM | POA: Insufficient documentation

## 2023-09-15 DIAGNOSIS — I4891 Unspecified atrial fibrillation: Secondary | ICD-10-CM | POA: Diagnosis not present

## 2023-09-15 SURGERY — INVASIVE LAB ABORTED CASE
Anesthesia: General

## 2023-09-15 MED ORDER — SODIUM CHLORIDE 0.9% FLUSH: 10.0000 mL | Freq: Two times a day (BID) | INTRAVENOUS | Status: DC

## 2023-09-15 NOTE — Interval H&P Note (Signed)
History and Physical Interval Note:  09/15/2023 8:21 AM  Diana Lee  has presented today for surgery, with the diagnosis of afib.  The various methods of treatment have been discussed with the patient and family. After consideration of risks, benefits and other options for treatment, the patient has consented to  Procedure(s): CARDIOVERSION (CATH LAB) (N/A) as a surgical intervention.  The patient's history has been reviewed, patient examined, no change in status, stable for surgery.  I have reviewed the patient's chart and labs.  Questions were answered to the patient's satisfaction.     Parke Poisson

## 2023-09-15 NOTE — Telephone Encounter (Signed)
Spoke with pt's daughter [name] (ok per DPR) regarding change in follow up appointment. Pt has been re-scheduled to see Dr. Jacques Navy on 12/16 @ 1p (ok to overbook per Dr. Jacques Navy). Daughter verbalizes understanding and reports that pt is doing well. Daughter states that pt is currently getting settled in at home.

## 2023-09-15 NOTE — Progress Notes (Addendum)
Pt in SR on presentation for cardioversion. Started amiodarone on 11/11.  Instructions given to family as follows: - resume metoprolol 12.5 mg daily - continue amiodarone 400 mg BID until 11/17 pm, then 11/18 am please start amiodarone 200 mg daily, given age this is more appropriate dosing.   Cardioversion canceled.  Will arrange f/u in 1 mo.   Parke Poisson, MD

## 2023-09-17 ENCOUNTER — Ambulatory Visit: Payer: Medicare Other | Admitting: General Practice

## 2023-09-21 ENCOUNTER — Telehealth: Payer: Self-pay | Admitting: Internal Medicine

## 2023-09-21 NOTE — Telephone Encounter (Signed)
Pt c/o medication issue:  1. Name of Medication: amiodarone (PACERONE) 200 MG tablet   2. How are you currently taking this medication (dosage and times per day)? 2 200 mg tablet in morning   3. Are you having a reaction (difficulty breathing--STAT)? No   4. What is your medication issue? Daughter is calling to make sure she is giving her mom the correct dosage. Please advise

## 2023-09-21 NOTE — Telephone Encounter (Signed)
Patient identification verified with daughter Jacolyn Reedy by 2 forms. Becky listed on DPR.  Shade Flood, RN    Reviewed med list and last office visit notes. Patient is to be taking  Take 400 mg twice a day for one week. Then take 200 mg twice a day for two weeks. Then take 200 mg daily.  [name] wrote out timeline for prescription taper. Verbalized understanding. No further questions at this time.

## 2023-09-24 ENCOUNTER — Ambulatory Visit: Payer: Medicare Other | Admitting: Adult Health

## 2023-10-04 ENCOUNTER — Telehealth: Payer: Self-pay | Admitting: Internal Medicine

## 2023-10-04 NOTE — Telephone Encounter (Signed)
  Please bring her in to see an APP this week  Thomasene Ripple, MD

## 2023-10-04 NOTE — Telephone Encounter (Signed)
Spoke to patient 's daughter  She states patient  is still  having some weakness and lightheadedness.   Daughter was concerned   Per daughter   symptoms have not changed.   Heart rate   today about hour ago - 44 , b/p 144/60 .  Yesterday  blood pressure  141/72  pulse 47 .    Amiodarone was reduced 200 mg  on 09/21/23.  Patient is now   taking Metoprolol succinate 12.5 mg  daily     Informed  daughter will have  a provider ( doctor of  the day )  review , Dr Jacques Navy not in the office today

## 2023-10-04 NOTE — Telephone Encounter (Signed)
STAT if HR is under 50 or over 120 (normal HR is 60-100 beats per minute)  What is your heart rate? 144/60 HR 44;   Do you have a log of your heart rate readings (document readings)?    Do you have any other symptoms? Weakness, lightheadness

## 2023-10-04 NOTE — Telephone Encounter (Signed)
Called daughter back.   RN offered next available appointment  10/05/23 at 10 am.  [name] states  it would be difficult to bring  patient to the appointment that time of day.  No other  appointment  at present time.  Daughter states,she would keep an appointment with Dr Jacques Navy Dec 16,2024. [name] states she will call back if needed.   [name] will continue giving medication as been directed.

## 2023-10-05 NOTE — Telephone Encounter (Addendum)
Called Becky  informed daughter of Dr  Jacques Navy response.   Becky verbalized understanding     Placed information on medication list

## 2023-10-05 NOTE — Telephone Encounter (Signed)
Ok to stop metoprolol until our appt. Keep amiodarone on board. Call us next week to follow up. GA

## 2023-10-18 ENCOUNTER — Encounter: Payer: Self-pay | Admitting: Internal Medicine

## 2023-10-18 ENCOUNTER — Ambulatory Visit: Payer: Medicare Other | Attending: Internal Medicine | Admitting: Internal Medicine

## 2023-10-18 VITALS — BP 110/56 | HR 54 | Ht 65.0 in | Wt 145.0 lb

## 2023-10-18 DIAGNOSIS — I483 Typical atrial flutter: Secondary | ICD-10-CM | POA: Diagnosis not present

## 2023-10-18 DIAGNOSIS — Z09 Encounter for follow-up examination after completed treatment for conditions other than malignant neoplasm: Secondary | ICD-10-CM

## 2023-10-18 DIAGNOSIS — I471 Supraventricular tachycardia, unspecified: Secondary | ICD-10-CM

## 2023-10-18 DIAGNOSIS — R5383 Other fatigue: Secondary | ICD-10-CM

## 2023-10-18 DIAGNOSIS — I4892 Unspecified atrial flutter: Secondary | ICD-10-CM | POA: Diagnosis not present

## 2023-10-18 DIAGNOSIS — Z79899 Other long term (current) drug therapy: Secondary | ICD-10-CM

## 2023-10-18 DIAGNOSIS — E038 Other specified hypothyroidism: Secondary | ICD-10-CM

## 2023-10-18 DIAGNOSIS — R42 Dizziness and giddiness: Secondary | ICD-10-CM

## 2023-10-18 DIAGNOSIS — D6869 Other thrombophilia: Secondary | ICD-10-CM

## 2023-10-18 NOTE — Progress Notes (Signed)
Cardiology Office Note:  .   Date:  10/18/2023  ID:  Diana Lee, DOB 06/19/1929, MRN [id number] PCP: [name], MD  [facility] Providers Cardiologist:  Parke Poisson, MD Electrophysiologist:  Lewayne Bunting, MD    History of Present Illness: .   Diana Lee is a 89 y.o. female.  Discussed the use of AI scribe software for clinical note transcription with the patient, who gave verbal consent to proceed.  History of Present Illness   The patient, a 89 year old with a history of atrial fibrillation, presents with worsening lightheadedness, dizziness, and fatigue. She reports that these symptoms have been ongoing for several weeks and have been progressively worsening. The patient attributes these symptoms to her current medication regimen, which includes amiodarone and metoprolol. She reports that her heart rate has been consistently low, often in the forties, and she has been feeling increasingly unwell. The patient's family corroborates this, noting that the patient has been feeling so poorly that she has been unable to participate in her usual activities, including cooking and reading. The patient has expressed a desire to discontinue the amiodarone in hopes of improving her symptoms. She has also expressed a desire to discuss end-of-life preferences, including a potential DNR order.        ROS: negative except per HPI above.  Studies Reviewed: Marland Kitchen   EKG Interpretation Date/Time:  Monday October 18 2023 13:04:30 EST Ventricular Rate:  54 PR Interval:  152 QRS Duration:  90 QT Interval:  472 QTC Calculation: 447 R Axis:   65  Text Interpretation: Sinus bradycardia Cannot rule out Anterior infarct (cited on or before 15-Sep-2023) When compared with ECG of 15-Sep-2023 08:29, ST no longer depressed in Anterior leads T wave inversion no longer evident in Inferior leads Confirmed by Weston Brass ([id number]) on 10/18/2023 1:20:25 PM    Results         Risk  Assessment/Calculations:    CHA2DS2-VASc Score = 4   This indicates a 4.8% annual risk of stroke. The patient's score is based upon: CHF History: 0 HTN History: 1 Diabetes History: 0 Stroke History: 0 Vascular Disease History: 0 Age Score: 2 Gender Score: 1      Physical Exam:   VS:  BP (!) 110/56 (BP Location: Left Arm, Patient Position: Sitting)   Pulse (!) 54   Ht 5\' 5"  (1.651 m)   Wt 145 lb (65.8 kg)   SpO2 98%   BMI 24.13 kg/m    Wt Readings from Last 3 Encounters:  10/18/23 145 lb (65.8 kg)  09/15/23 140 lb (63.5 kg)  09/13/23 140 lb 6.4 oz (63.7 kg)     Physical Exam   CHEST: Lung sounds clear on auscultation. CARDIOVASCULAR: Heart sounds normal.     GEN: Well nourished, well developed in no acute distress NECK: No JVD; No carotid bruits CARDIAC: RRR, no murmurs, rubs, gallops RESPIRATORY:  Clear to auscultation without rales, wheezing or rhonchi  ABDOMEN: Soft, non-tender, non-distended EXTREMITIES:  No edema; No deformity   ASSESSMENT AND PLAN: .    1. Typical atrial flutter (HCC)   2. Follow-up exam   3. Atrial flutter with rapid ventricular response (HCC)   4. Fatigue, unspecified type   5. Lightheadedness   6. Secondary hypercoagulable state (HCC)   7. Other specified hypothyroidism   8. Medication management   9. SVT (supraventricular tachycardia) (HCC)   10. Dizziness     Assessment and Plan    Atrial Fibrillation Intermittent episodes of tachycardia, currently  in sinus rhythm. Patient experiencing fatigue and dizziness, possibly due to low heart rate secondary to amiodarone. -Discontinue amiodarone and monitor for improvement in symptoms. Long discussion regarding GOC, I offered to reduce dose of amiodarone to 100 mg daily and monitor symptoms but patient feels profoundly unwell and may benefit more from cessation for quality of life.  -Consider pacemaker eval if heart rate does not improve, patient not really inclined but will decide if and  when needed.  Hypertension Elevated blood pressure readings at home. -Tolerate slightly higher blood pressure given patient's age and overall health status.  End of Life Care Patient expressed interest in DNR/DNI. -We have filled out DNR/DNI form in office today.   Follow-up -Plan for phone call check-in on November 01, 2023. -Schedule office visit for 6 months.               I spent 30 minutes in the care of [name] today including reviewing labs (09/13/23), face to face time discussing treatment options (22 min discussing GOC and medication mgmt), and documenting in the encounter.   Weston Brass, MD, [facility] [place]  [facility]

## 2023-10-18 NOTE — Patient Instructions (Signed)
Medication Instructions:  Stop: Amiodarone (Pacerone) *If you need a refill on your cardiac medications before your next appointment, please call your pharmacy*  Lab Work: None  Follow-Up: At [facility], you and your health needs are our priority.  As part of our continuing mission to provide you with exceptional heart care, we have created designated Provider Care Teams.  These Care Teams include your primary Cardiologist (physician) and Advanced Practice Providers (APPs -  Physician Assistants and Nurse Practitioners) who all work together to provide you with the care you need, when you need it.  Your next appointment:   6 month(s)  Provider:   Parke Poisson, MD

## 2023-12-09 ENCOUNTER — Other Ambulatory Visit: Payer: Self-pay | Admitting: Cardiology

## 2024-05-02 DEATH — deceased

## 2024-05-24 ENCOUNTER — Telehealth: Payer: Self-pay | Admitting: Internal Medicine

## 2024-05-24 NOTE — Telephone Encounter (Signed)
 New Message:          Daughter called and wanted Dr Loni to know that the patient passed away on 2024-05-17.

## 2024-05-24 NOTE — Telephone Encounter (Signed)
 Called and spoke to daughter, Rhoda. Our condolences given to her on behalf of Dr. Loni. Let her know that Dr. Loni is thinking of them. Rhoda stated, Thank you so much. She really loved Dr. Loni. Becky very appreciative of phone call.
